# Patient Record
Sex: Female | Born: 1965 | Race: Black or African American | Hispanic: No | Marital: Married | Smoking: Never smoker
Health system: Southern US, Community
[De-identification: ages and names within clinical notes are randomized; demographics above are authoritative.]

## PROBLEM LIST (undated history)

## (undated) DIAGNOSIS — K529 Noninfective gastroenteritis and colitis, unspecified: Secondary | ICD-10-CM

## (undated) DIAGNOSIS — E039 Hypothyroidism, unspecified: Secondary | ICD-10-CM

## (undated) HISTORY — DX: Hypothyroidism, unspecified: E03.9

## (undated) HISTORY — PX: BREAST CYST EXCISION: SHX579

## (undated) HISTORY — PX: THYROIDECTOMY: SHX17

## (undated) HISTORY — PX: DILATION AND CURETTAGE, DIAGNOSTIC / THERAPEUTIC: SUR384

## (undated) HISTORY — PX: COLONOSCOPY: SHX174

## (undated) HISTORY — DX: Noninfective gastroenteritis and colitis, unspecified: K52.9

---

## 2004-01-21 ENCOUNTER — Emergency Department (HOSPITAL_COMMUNITY): Admission: EM | Admit: 2004-01-21 | Discharge: 2004-01-21 | Payer: Self-pay | Admitting: Emergency Medicine

## 2004-01-21 ENCOUNTER — Encounter (INDEPENDENT_AMBULATORY_CARE_PROVIDER_SITE_OTHER): Payer: Self-pay | Admitting: *Deleted

## 2005-01-13 ENCOUNTER — Emergency Department (HOSPITAL_COMMUNITY): Admission: EM | Admit: 2005-01-13 | Discharge: 2005-01-13 | Payer: Self-pay | Admitting: Emergency Medicine

## 2005-02-11 IMAGING — US US ABDOMEN COMPLETE
1 series · 14 of 25 positions shown · non-contrast
Comparison: none

CLINICAL DATA: Patient has abdominal pain.  
 ULTRASOUND OF THE ABDOMEN
 Gallbladder is not optimally dilated however no definite stone.  Gallbladder wall is normal.  The common bile duct is normal measuring 4 to 5 mm.  The liver, IVC, pancreas and spleen are normal.  The right kidney measures 10.2 cm and the left kidney 9.9 cm.  Aorta is normal measuring 1.6 cm.  
 IMPRESSION
 No abnormality.  Gallbladder is not optimally distended however, no definite gallstones are visualized.

[Series 1: unknown · 0.27mm/px · 14 of 89 slices shown]
[im 1/89]
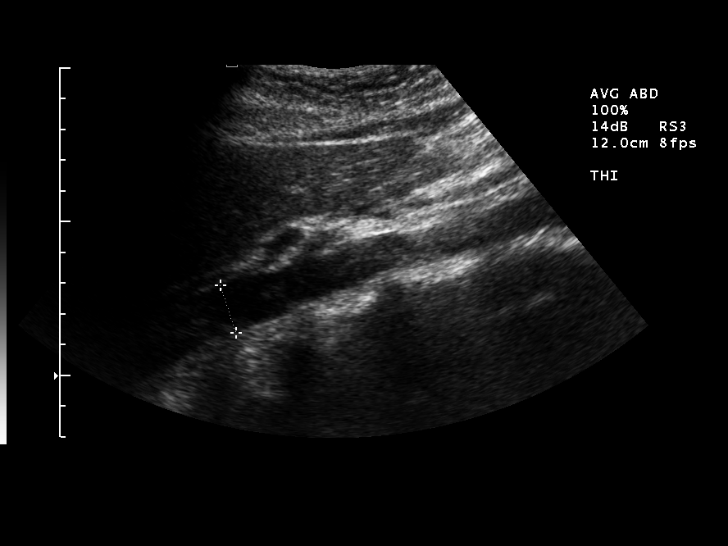
[im 8/89]
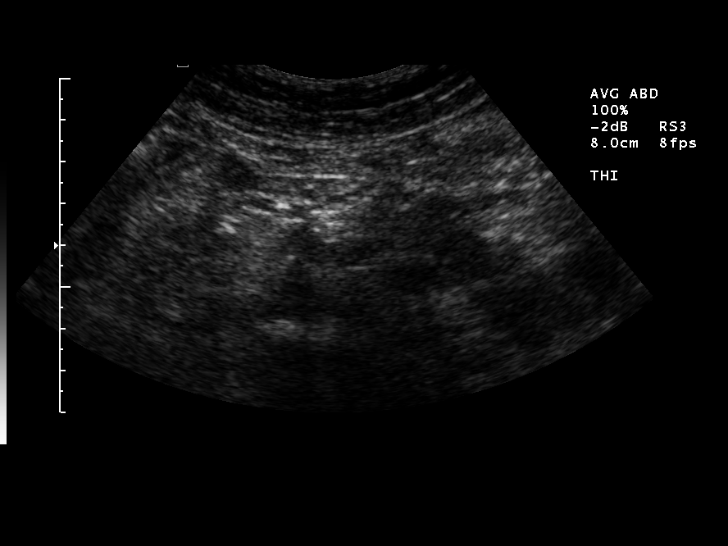
[im 15/89]
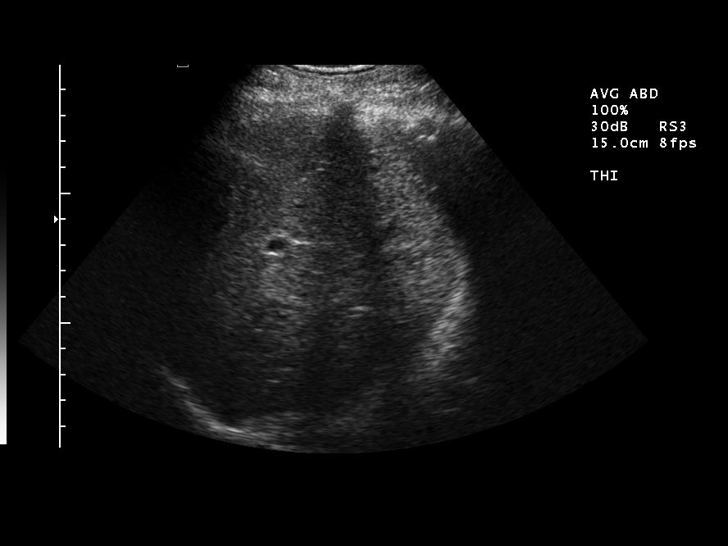
[im 23/89]
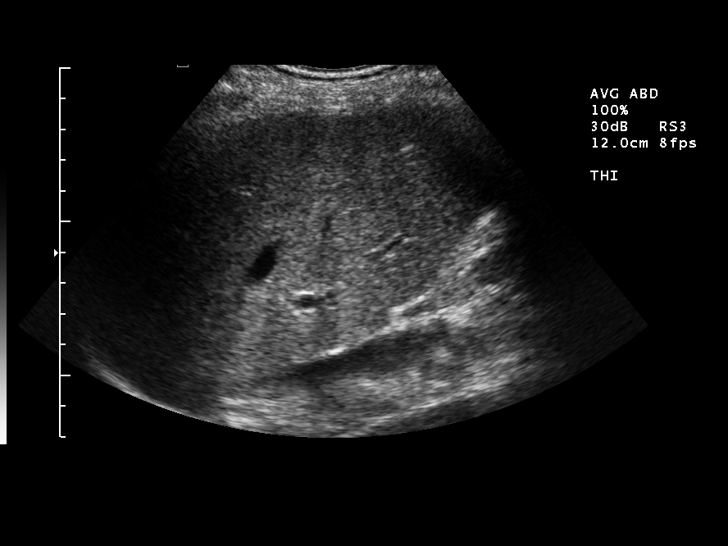
[im 30/89]
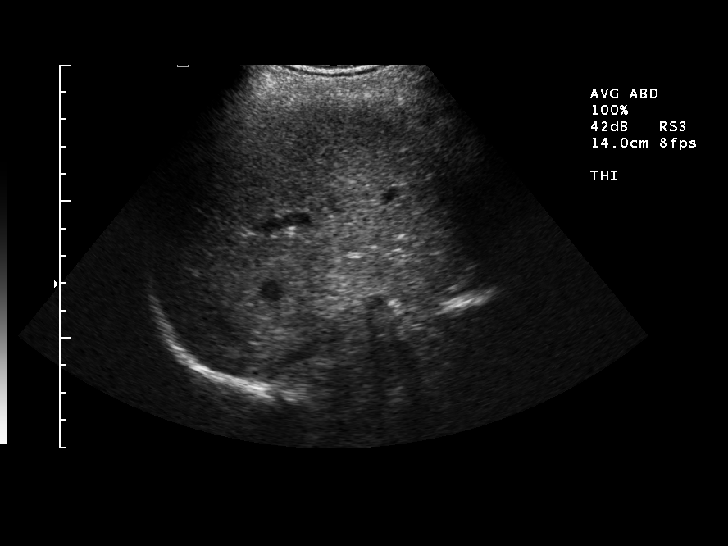
[im 34/89]
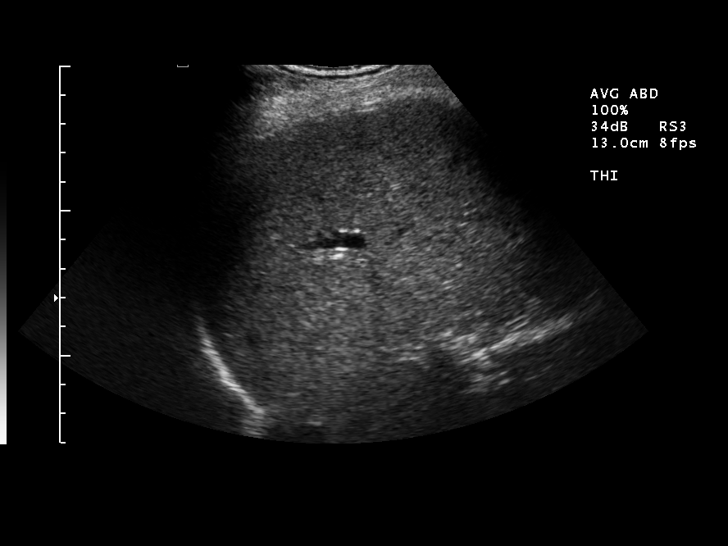
[im 41/89]
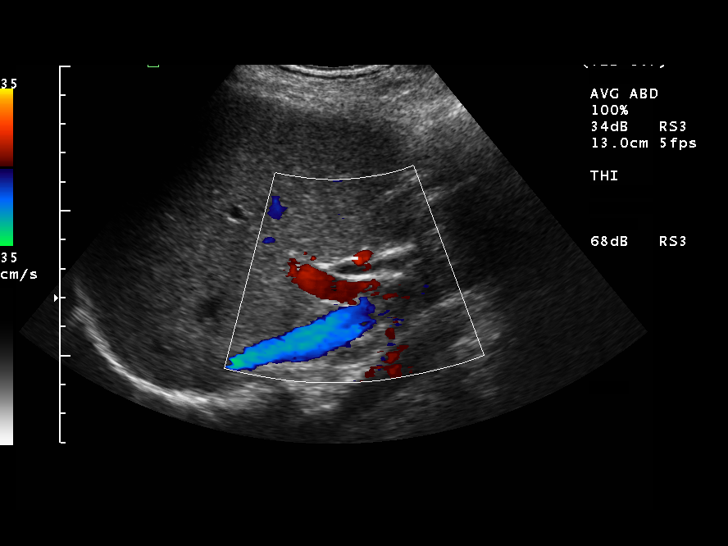
[im 48/89]
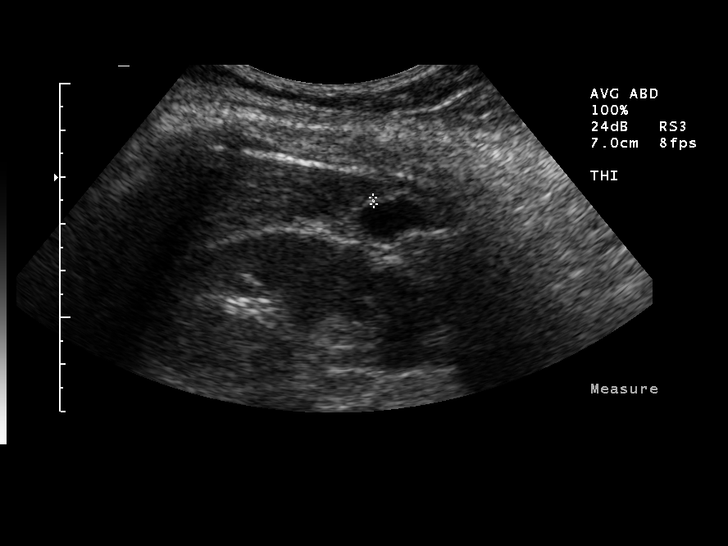
[im 56/89]
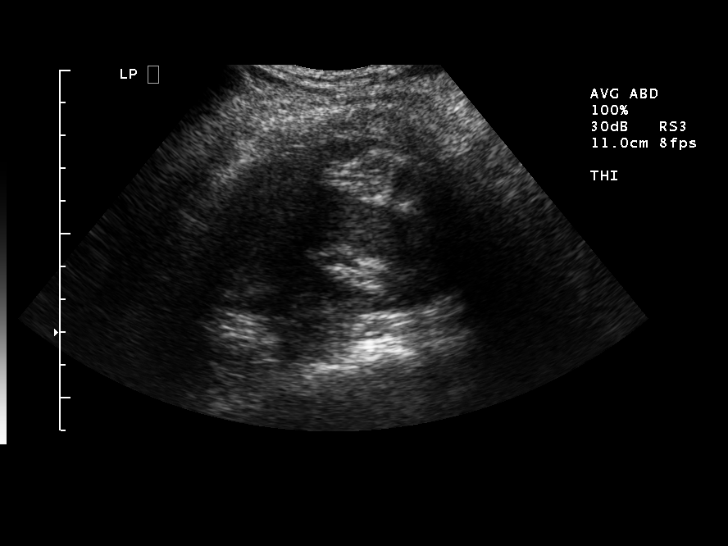
[im 59/89]
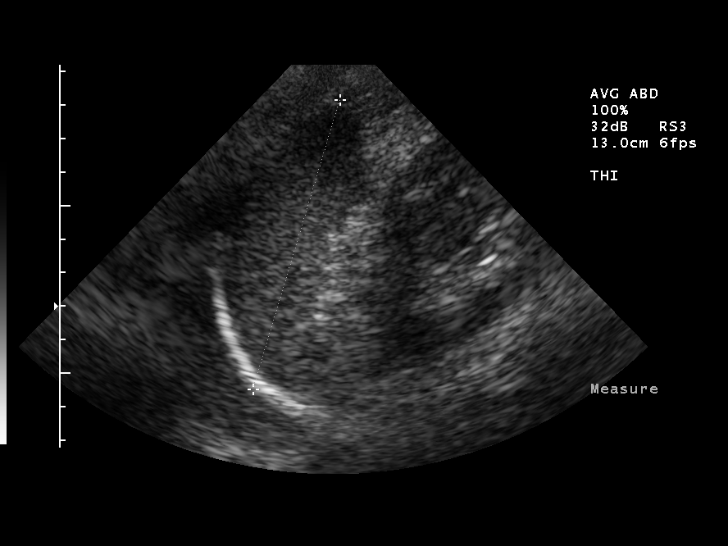
[im 67/89]
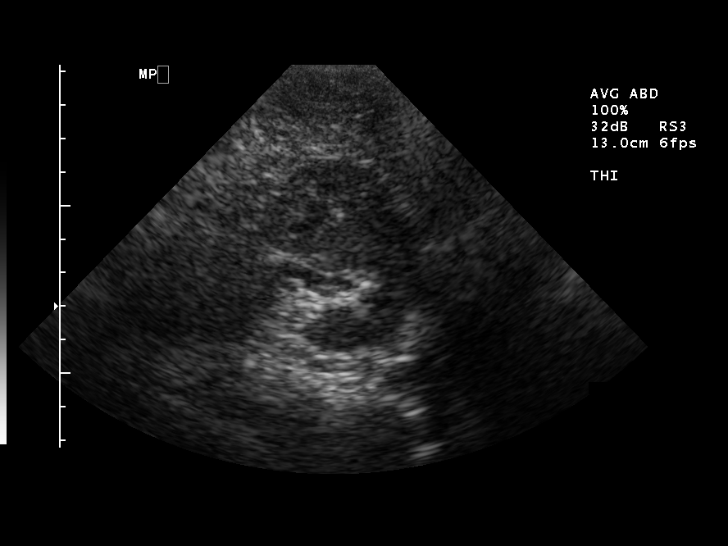
[im 74/89]
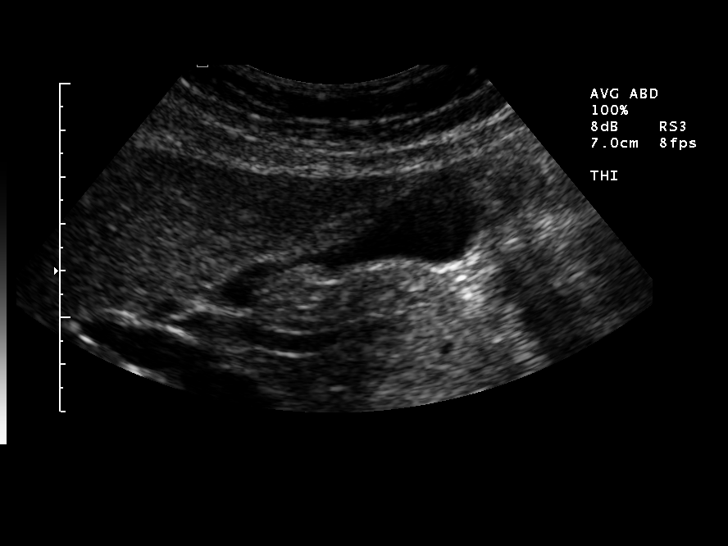
[im 81/89]
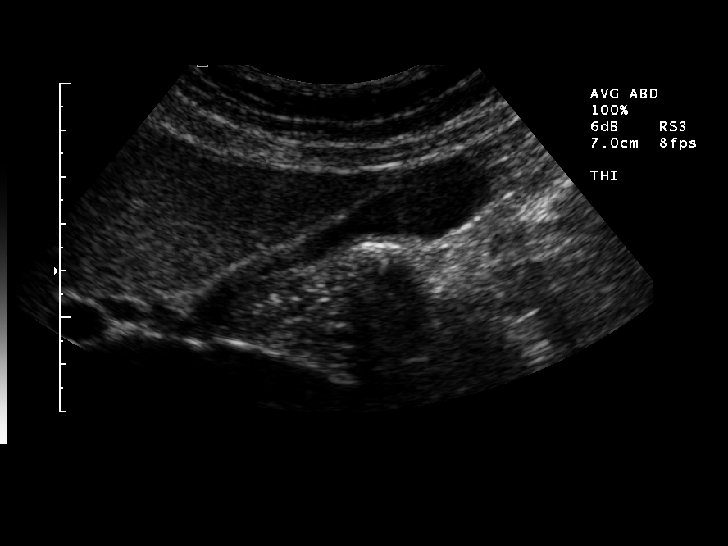
[im 89/89]
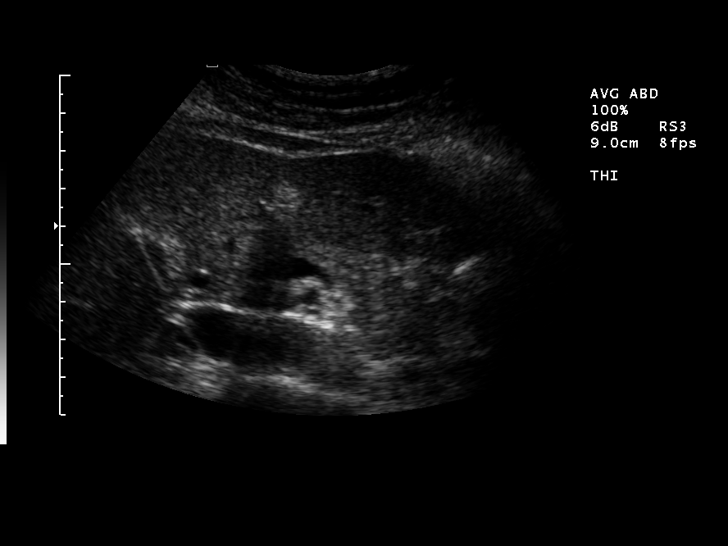

[14 of 25 positions shown; findings below may reference images not displayed]

## 2005-02-23 ENCOUNTER — Ambulatory Visit (HOSPITAL_COMMUNITY): Admission: AD | Admit: 2005-02-23 | Discharge: 2005-02-23 | Payer: Self-pay | Admitting: Obstetrics and Gynecology

## 2005-02-23 ENCOUNTER — Encounter (INDEPENDENT_AMBULATORY_CARE_PROVIDER_SITE_OTHER): Payer: Self-pay | Admitting: Specialist

## 2006-02-04 IMAGING — US US OB COMP LESS 14 WK
1 series · 14 of 28 positions shown · non-contrast
Comparison: none

CLINICAL DATA: 38 year old female with pelvic pain and a positive pregnancy test.
ULTRASOUND OB COMPLETE <14 WEEKS:
Transabdominal scanning only.  Transvaginal scanning was refused by the patient.

[Series 1: unknown · 0.30mm/px · 14 of 33 slices shown]
[im 2/33]
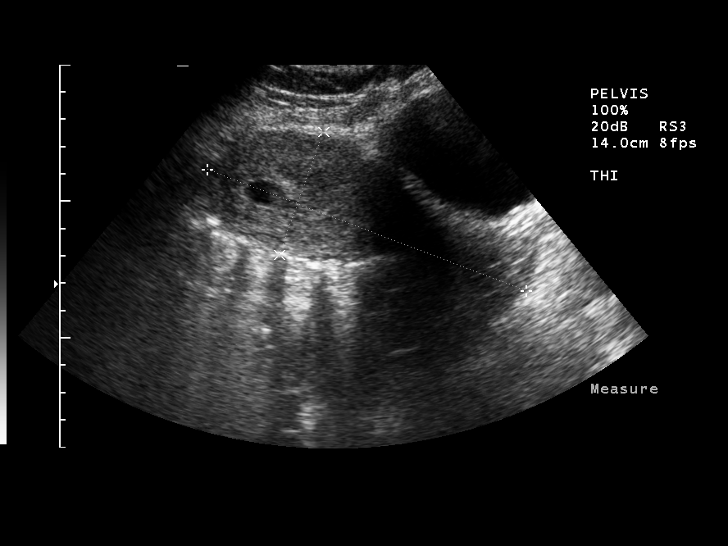
[im 4/33]
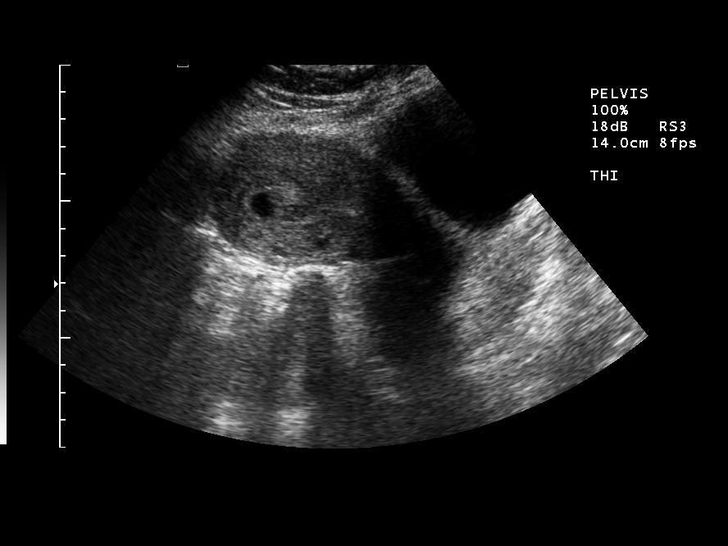
[im 6/33]
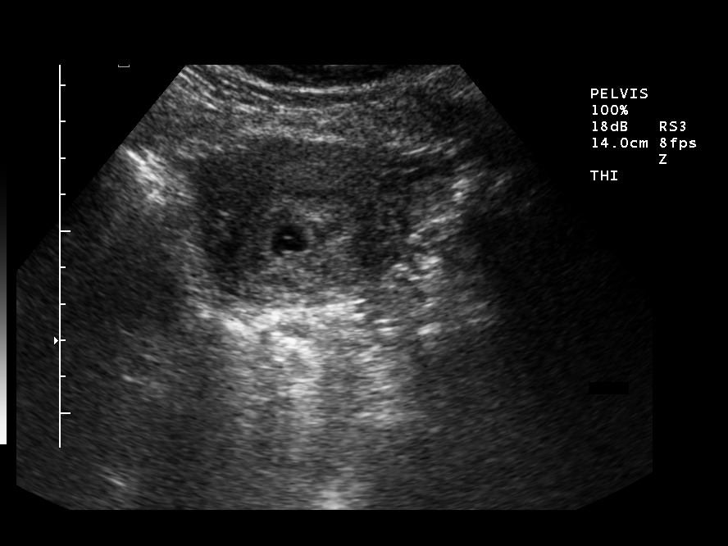
[im 9/33]
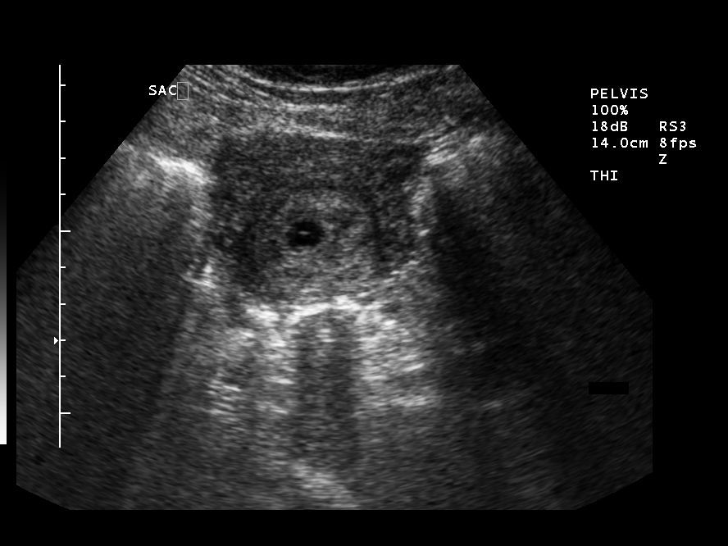
[im 11/33]
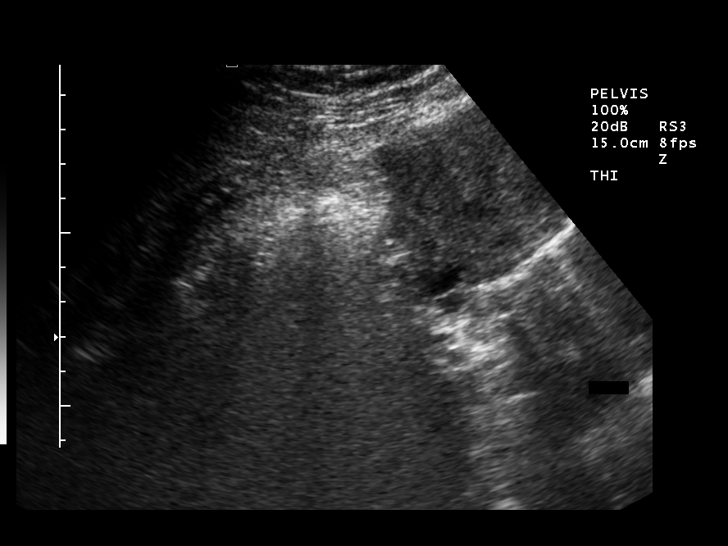
[im 14/33]
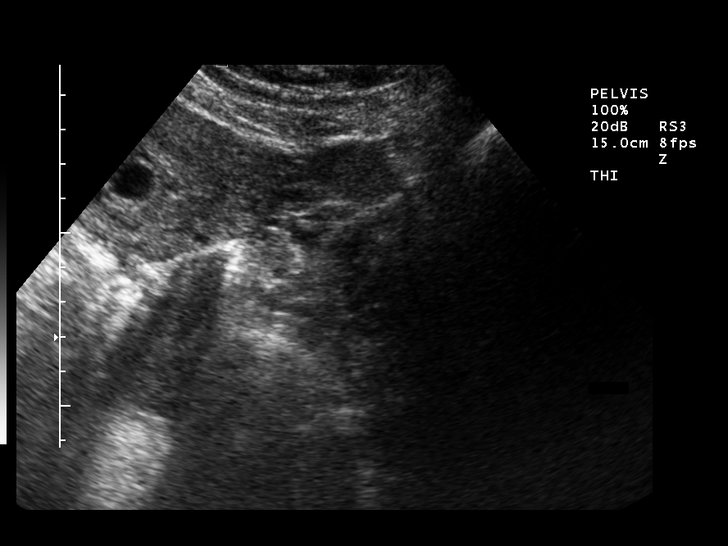
[im 16/33]
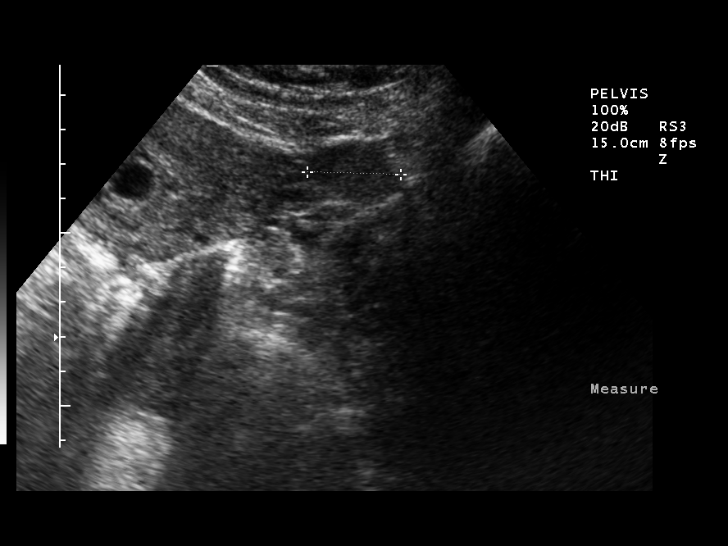
[im 18/33]
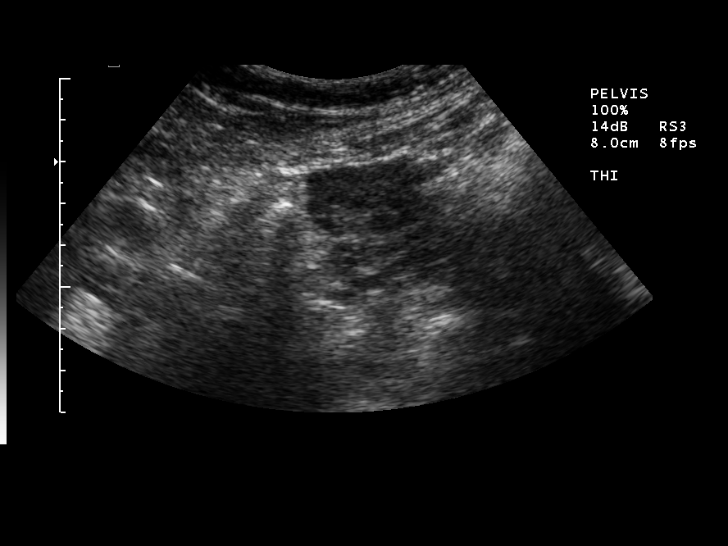
[im 21/33]
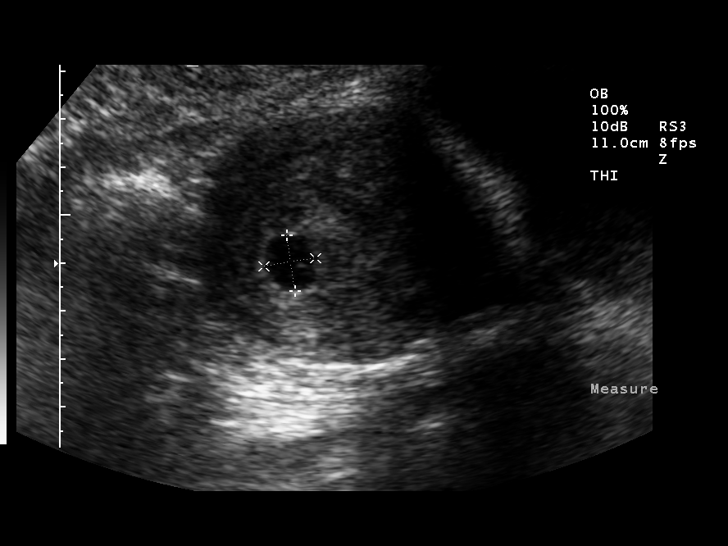
[im 23/33]
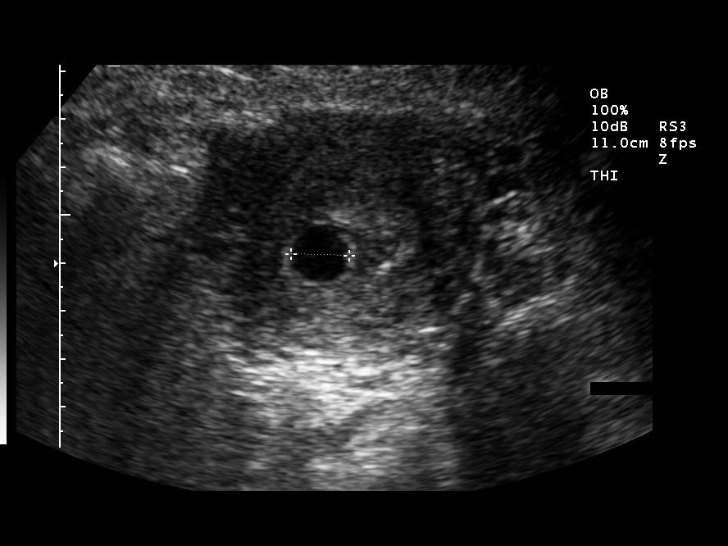
[im 25/33]
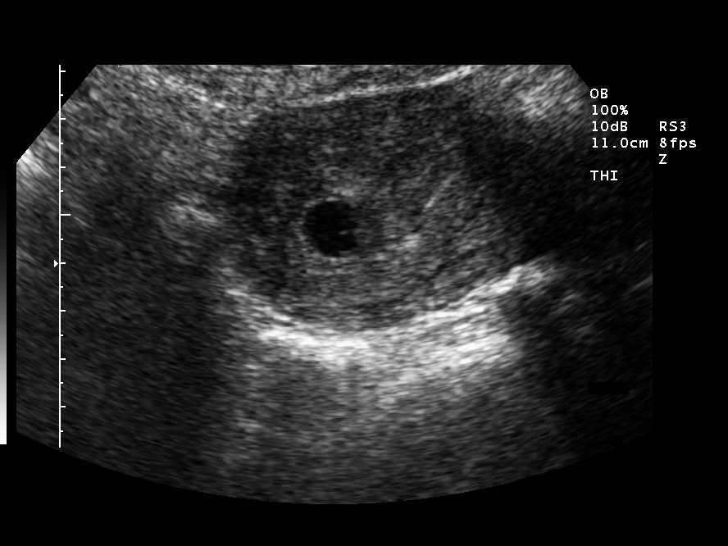
[im 28/33]
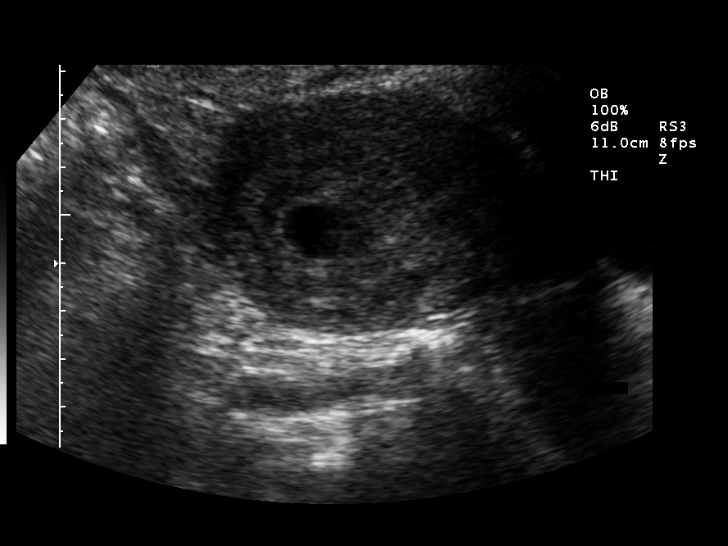
[im 30/33]
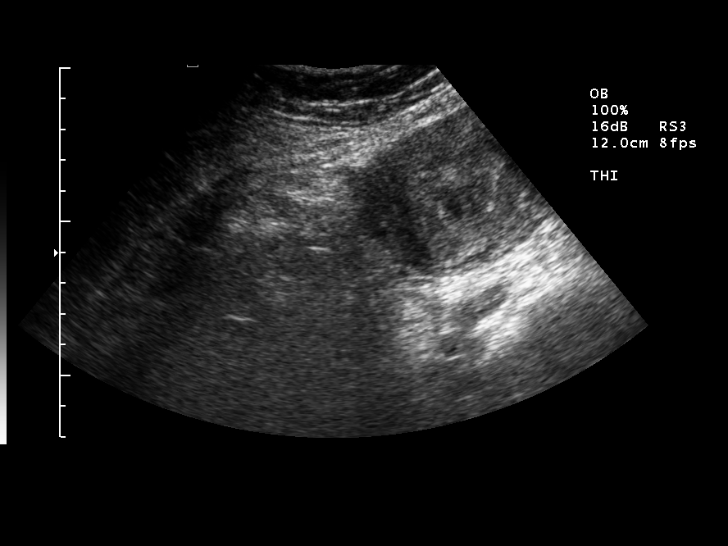
[im 33/33]
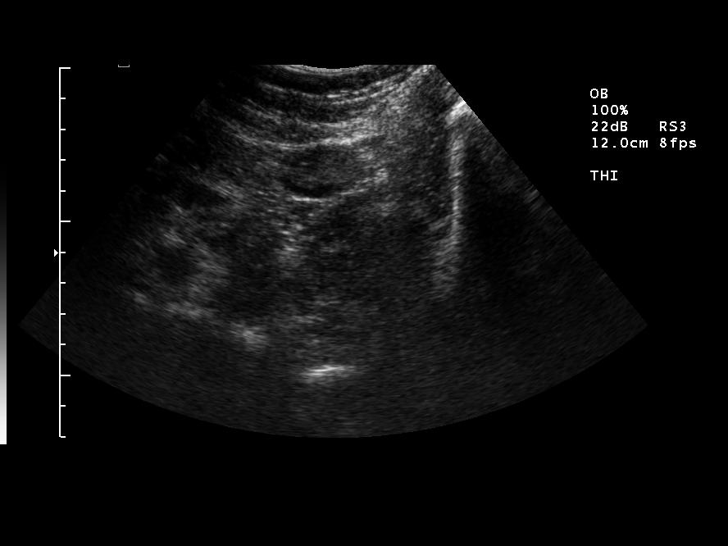

[14 of 28 positions shown; findings below may reference images not displayed]

FINDINGS: A small intrauterine gestational sac is demonstrated.  The mean sac diameter is 11.4 mm correlating with a 5 week 6 day gestational age.  The yolk sac is visualized.  Currently no embryo or fetal pole is identified.  No subchorionic hemorrhage.  The right ovary was not visualized.  The left ovary is normal.  No free fluid.
IMPRESSION: 1.  Early IUP identified with a mean sac diameter of 11.4 mm correlating with a 5 week 6 day gestational age.  
2.  Tiny yolk sac is visualized but no embryo at this early of an age.
3.  No subchorionic hemorrhage.
4.  Nonvisualization of the right ovary.
5.  Normal left ovary.
Note:  Follow-up scanning could be performed to document adequate progression of the pregnancy.

## 2009-11-23 ENCOUNTER — Ambulatory Visit (HOSPITAL_COMMUNITY): Admission: RE | Admit: 2009-11-23 | Discharge: 2009-11-23 | Payer: Self-pay | Admitting: Obstetrics and Gynecology

## 2009-12-22 ENCOUNTER — Encounter (INDEPENDENT_AMBULATORY_CARE_PROVIDER_SITE_OTHER): Payer: Self-pay | Admitting: *Deleted

## 2010-01-04 DIAGNOSIS — E039 Hypothyroidism, unspecified: Secondary | ICD-10-CM | POA: Insufficient documentation

## 2010-01-04 DIAGNOSIS — K625 Hemorrhage of anus and rectum: Secondary | ICD-10-CM

## 2010-01-06 ENCOUNTER — Ambulatory Visit: Payer: Self-pay | Admitting: Internal Medicine

## 2010-01-06 DIAGNOSIS — D649 Anemia, unspecified: Secondary | ICD-10-CM

## 2010-01-18 ENCOUNTER — Telehealth: Payer: Self-pay | Admitting: Internal Medicine

## 2010-02-02 ENCOUNTER — Telehealth: Payer: Self-pay | Admitting: Internal Medicine

## 2010-02-10 ENCOUNTER — Ambulatory Visit: Payer: Self-pay | Admitting: Internal Medicine

## 2010-02-10 DIAGNOSIS — K5190 Ulcerative colitis, unspecified, without complications: Secondary | ICD-10-CM | POA: Insufficient documentation

## 2010-04-28 ENCOUNTER — Telehealth: Payer: Self-pay | Admitting: Internal Medicine

## 2010-05-03 ENCOUNTER — Ambulatory Visit: Payer: Self-pay | Admitting: Internal Medicine

## 2010-05-03 ENCOUNTER — Telehealth: Payer: Self-pay | Admitting: Internal Medicine

## 2010-05-03 DIAGNOSIS — R197 Diarrhea, unspecified: Secondary | ICD-10-CM

## 2010-05-03 DIAGNOSIS — K5289 Other specified noninfective gastroenteritis and colitis: Secondary | ICD-10-CM

## 2010-05-03 DIAGNOSIS — R109 Unspecified abdominal pain: Secondary | ICD-10-CM | POA: Insufficient documentation

## 2010-05-03 DIAGNOSIS — R1084 Generalized abdominal pain: Secondary | ICD-10-CM

## 2010-05-04 LAB — CONVERTED CEMR LAB
CRP, High Sensitivity: 3.04 (ref 0.00–5.00)
MCHC: 31.5 g/dL (ref 30.0–36.0)
RBC: 4.73 M/uL (ref 3.87–5.11)
RDW: 20.6 % — ABNORMAL HIGH (ref 11.5–14.6)

## 2010-05-09 ENCOUNTER — Ambulatory Visit: Payer: Self-pay | Admitting: Physician Assistant

## 2010-05-09 ENCOUNTER — Ambulatory Visit: Payer: Self-pay | Admitting: Internal Medicine

## 2010-05-11 ENCOUNTER — Encounter: Payer: Self-pay | Admitting: Internal Medicine

## 2010-06-21 ENCOUNTER — Ambulatory Visit: Payer: Self-pay | Admitting: Internal Medicine

## 2010-06-24 LAB — CONVERTED CEMR LAB
Ferritin: 7 ng/mL — ABNORMAL LOW (ref 10.0–291.0)
Saturation Ratios: 3.1 % — ABNORMAL LOW (ref 20.0–50.0)
Transferrin: 327.6 mg/dL (ref 212.0–360.0)

## 2010-07-06 ENCOUNTER — Telehealth: Payer: Self-pay | Admitting: Internal Medicine

## 2010-08-08 ENCOUNTER — Ambulatory Visit: Payer: Self-pay | Admitting: Internal Medicine

## 2010-09-27 NOTE — Letter (Signed)
Summary: Patient Notice- Colon Biospy Results  [facility]  [address] [place], [place] [address]   Phone: [phone]  Fax: [phone]        May 11, 2010 MRN: [id number]    [facility] [address] [place], [place]  [address]    Dear Ms. Bernabe,  I am pleased to inform you that the biopsies taken during your recent colonoscopy did not show any evidence of cancer upon pathologic examination.The biopsies show active ulcerative colitis in 3 out of 4 biopsies, Your colitis is predominanetly in the left side of Your colon  Additional information/recommendations:  __No further action is needed at this time.  Please follow-up with      your primary care physician for your other healthcare needs.  _x_Please call [phone] to schedule a return visit to review      your condition.My office will call You with an appointment  _x_Continue with the treatment plan as outlined on the day of your      exam.In 2 weeks, You can go down on Your Prednisone to 20 mg/day.  _x_You should have a repeat colonoscopy examination for this problem           in _5 years.  Please call us if you are having persistent problems or have questions about your condition that have not been fully answered at this time.Please take Iron every day.  Sincerely,  Hart Carwin MD   This letter has been electronically signed by your physician.  Appended Document: Patient Notice- Colon Biospy Results letter mailed

## 2010-09-27 NOTE — Assessment & Plan Note (Signed)
Summary: F/U APPT...LSW.   History of Present Illness Visit Type: Follow-up Visit Primary GI MD: [name] MD Primary Provider: Huel Cote, MD Requesting Provider: Huel Cote, MD Chief Complaint: Rectal bleeding has stopped, black tarry stools now History of Present Illness:   This is a 45 year old African female from [place], with new onset ulcerative colitis. She declines   colonoscopy due to being uninsured. She responded to an empirical  prednisone taper and has been off prednisone completely for several weeks. We have been able to give her samples of Lialda 1.2 gm which she initially took two a day but developed severe headaches and we had to decrease it to once a day. She is currently doing well and has not seen any blood in her stool for the past several days. Her stools are dark but formed. There has been no abdominal pain.   GI Review of Systems    Reports abdominal pain.     Location of  Abdominal pain: lower abdomen.    Denies acid reflux, belching, bloating, chest pain, dysphagia with liquids, dysphagia with solids, heartburn, loss of appetite, nausea, vomiting, vomiting blood, weight loss, and  weight gain.      Reports black tarry stools, change in bowel habits, constipation, diarrhea, and  rectal bleeding.     Denies anal fissure, diverticulosis, fecal incontinence, heme positive stool, hemorrhoids, irritable bowel syndrome, jaundice, light color stool, liver problems, and  rectal pain.    Current Medications (verified): 1)  Anusol-Hc 25 Mg Supp (Hydrocortisone Acetate) .... Insert 1 Suppository Into Rectum At Bedtime  Allergies (verified): No Known Drug Allergies  Past History:  Past Medical History: Reviewed history from 01/04/2010 and no changes required. Current Problems:  HYPOTHYROIDISM (ICD-244.9) RECTAL BLEEDING (ICD-569.3)    Past Surgical History: C-Section x 3 D &C after missed abortion in 2006 Thyroidectomy Breast Cystectomy D & C  -10/2009  Family History: Reviewed history from 01/06/2010 and no changes required. No FH of Colon Cancer: Stomach Cancer: MGM Family History of Diabetes: PGF Family History of Heart Disease: Father   Social History: Reviewed history from 01/06/2010 and no changes required. Self Employed Married 3 childern Patient has never smoked.  Alcohol Use - no Illicit Drug Use - no  Review of Systems       The patient complains of allergy/sinus, fatigue, headaches-new, muscle pains/cramps, night sweats, sleeping problems, swelling of feet/legs, and thirst - excessive.  The patient denies anemia, anxiety-new, arthritis/joint pain, back pain, blood in urine, breast changes/lumps, change in vision, confusion, cough, coughing up blood, depression-new, fainting, fever, hearing problems, heart murmur, heart rhythm changes, itching, menstrual pain, nosebleeds, pregnancy symptoms, shortness of breath, skin rash, sore throat, swollen lymph glands, thirst - excessive , urination - excessive , urination changes/pain, urine leakage, vision changes, and voice change.         Pertinent positive and negative review of systems were noted in the above HPI. All other ROS was otherwise negative.   Vital Signs:  Patient profile:   45 year old female Height:      64 inches Weight:      137.50 pounds BMI:     23.69 Pulse rate:   100 / minute Pulse rhythm:   regular BP sitting:   106 / 70  (right arm) Cuff size:   regular  Vitals Entered By: June McMurray CMA Duncan Dull) (February 10, 2010 11:02 AM)  Physical Exam  General:  Well developed, well nourished, no acute distress. Mouth:  No deformity or  lesions, dentition normal. Lungs:  Clear throughout to auscultation. Heart:  Regular rate and rhythm; no murmurs, rubs,  or bruits. Abdomen:  soft nontender abdomen with normoactive bowel sounds. No distention. Minimal discomfort on deep pressure in left lower quadrant. Rectal:  soft Hemoccult positive  stool. Extremities:  No clubbing, cyanosis, edema or deformities noted. Skin:  Intact without significant lesions or rashes. Psych:  Alert and cooperative. Normal mood and affect.   Impression & Recommendations:  Problem # 1:  RECTAL BLEEDING (ICD-569.3) Bleeding has stopped after taking prednisone taper.  Problem # 2:  UNSPECIFIED ULCERATIVE COLITIS (ICD-556.9) Patient has responded to prednisone taper. She is currently doing well on Lialda 1.2 g daily. We will increase to 2 tablets a day. I have given her more samples. She will need a colonoscopy when she acquires medical insurance. She is to call in next 4 weeks to report on her status. She will take Lialda 2-4 tablets a day gradually.  Patient Instructions: 1)  Lialda 1.2 g take 2 a day. 2)  Call us with status update in 4 weeks. 3)  Colonoscopy in the the near future. 4)  Copy sent to :Dr Huel Cote 5)  The medication list was reviewed and reconciled.  All changed / newly prescribed medications were explained.  A complete medication list was provided to the patient / caregiver.

## 2010-09-27 NOTE — Letter (Signed)
Summary: Appt Reminder 2  [facility]  [address] [place], [place] [address]   Phone: [phone]  Fax: [phone]        May 11, 2010 MRN: [id number]    [facility] [address] [place], [place]  [address]    Dear Ms. Flynt,   You have a return appointment with Dr. Juanda Chance on 06/13/10 at 10:15 a.m.  Please remember to bring a complete list of the medicines you are taking, your insurance card and your co-pay.  If you have to cancel or reschedule this appointment, please call before 5:00 pm the evening before to avoid a cancellation fee.  If you have any questions or concerns, please call [phone].    Sincerely,    Darcey Nora RN, CGRN  Appended Document: Appt Reminder 2 letter mailed to patient's home

## 2010-09-27 NOTE — Progress Notes (Signed)
Summary: triage  Phone Note Call from Patient Call back at Home Phone [phone]   Caller: Patient Call For: Dr Juanda Chance Reason for Call: Talk to Nurse Summary of Call: Patient wants to speak to nurse regarding severe diarrhea and blood in her stools, states that treatment given by Dr Juanda Chance is not working. Initial call taken by: Tawni Levy,  April 28, 2010 11:12 AM  Follow-up for Phone Call        patient is scheduled to come for an office visit 05/06/10 11:30 Follow-up by: Darcey Nora RN, CGRN,  April 28, 2010 12:02 PM

## 2010-09-27 NOTE — Progress Notes (Signed)
Summary: advise  Phone Note Call from Patient Call back at Home Phone [phone]   Caller: Patient Call For: Dr. Juanda Chance Reason for Call: Talk to Nurse Summary of Call: would like to consult directly with Dr. Juanda Chance about what her next step should be since finishing Prednisone Initial call taken by: Vallarie Mare,  July 06, 2010 11:45 AM  Follow-up for Phone Call        Pt. says she has seen improvement in stools as they are still loose but does not see any blood.Given samples of Lialda and r.o.v.appt as she was unable to keep prior appt.Says she still wants to speak to Dr.Brodie personally and I informed her that the dr.is not in the office the rest of the week but that I will forward this request to her desktop. Follow-up by: Teryl Lucy RN,  July 06, 2010 12:26 PM

## 2010-09-27 NOTE — Assessment & Plan Note (Signed)
Summary: rectal bleeding, diarrhea, vomiting/sheri   History of Present Illness Visit Type: Follow-up Visit Primary GI MD: [name] MD Primary Provider: Huel Cote, MD Requesting Provider: n/a Chief Complaint: pt had diarrhea and vomiting last night, pt also states she has BRB with each BM, she sees the blood on the stool, tissue and in toilet.  Pt also states the bleeding she was having last night was like bloody mucous History of Present Illness:   45 Y.O AFRICAN FEMALE FROM  [place],RECENTLY KNOWN TO DR. Juanda Chance. SHE HAS BEEN GIVEN DX OF COLITIS BASED ON SXS, AND ANOSCOPY SHOWING A PROCTITIS. SHE DID NOT HAVE A COLONOSCOPY AS SHE DID NOT HAVE INSURANCE.SHE WAS TREATED WITH A COURSE OF STEROIDS WHICH SHE DID RESPOND TO, AND HAS BEEN ON LOW DOSE LIALDA  OVER THE PAST FEW MONTHS-LAST SEEN 6/11.SHE FEELS THE LIALDA AT HIGHER DOSE GAVE HER HEADACHES. SHE COMES IN NOW AFTER A BAD NIGHT LAST NIGHT WITH ABDOMINAL CRAMPING,DIARRHEA,BLOOD IN STTOLS AND VOMITING. SHE IS KEEPING FLUIDS DOWN TODAY. NO FEVER/CHILLS. SHE SAYS SHE HAS NOT FELT WELL ALL SUMMER ,AND HAS HAD ONGOING DIARRHEA,WITH SEVERAL BM'S DAILY, AND SMALL AMTS OF BLOOD. WEIGHT HAS BEEN STABLE.   GI Review of Systems    Reports vomiting.      Denies abdominal pain, acid reflux, belching, bloating, chest pain, dysphagia with liquids, dysphagia with solids, heartburn, loss of appetite, nausea, vomiting blood, weight loss, and  weight gain.      Reports diarrhea and  rectal bleeding.     Denies anal fissure, black tarry stools, change in bowel habit, constipation, diverticulosis, fecal incontinence, heme positive stool, hemorrhoids, irritable bowel syndrome, jaundice, light color stool, liver problems, and  rectal pain.               Current Medications (verified): 1)  Lialda 1.2 Gm Tbec (Mesalamine) .... Take 2 Tablets By Mouth Once Daily  Allergies (verified): No Known Drug Allergies  Past History:  Past Medical  History: Current Problems:  HYPOTHYROIDISM (ICD-244.9) UNSPECIFIED COLITIS    Past Surgical History: C-Section x 3 D &C after missed abortion in 2006 Thyroidectomy Breast Cystectomy D & C -10/2009      Family History: Reviewed history from 01/06/2010 and no changes required. No FH of Colon Cancer: Stomach Cancer: MGM Family History of Diabetes: PGF Family History of Heart Disease: Father   Social History: Reviewed history from 01/06/2010 and no changes required. Self Employed Married 3 childern Patient has never smoked.  Alcohol Use - no Illicit Drug Use - no  Review of Systems  The patient denies allergy/sinus, anemia, anxiety-new, arthritis/joint pain, back pain, blood in urine, breast changes/lumps, confusion, cough, coughing up blood, depression-new, fainting, fatigue, fever, headaches-new, hearing problems, heart murmur, heart rhythm changes, itching, menstrual pain, muscle pains/cramps, night sweats, nosebleeds, pregnancy symptoms, shortness of breath, skin rash, sleeping problems, sore throat, swelling of feet/legs, swollen lymph glands, thirst - excessive, urination - excessive, urination changes/pain, urine leakage, vision changes, and voice change.         SEE HPI  Vital Signs:  Patient profile:   45 year old female Height:      64 inches Weight:      136 pounds BMI:     23.43 Pulse rate:   80 / minute Pulse rhythm:   regular BP sitting:   92 / 56  (right arm) Cuff size:   regular  Vitals Entered By: Francee Piccolo CMA Duncan Dull) (May 03, 2010 2:49 PM)  Physical  Exam  General:  Well developed, well nourished, no acute distress. Head:  Normocephalic and atraumatic. Eyes:  PERRLA, no icterus. Lungs:  Clear throughout to auscultation. Heart:  Regular rate and rhythm; no murmurs, rubs,  or bruits. Abdomen:  SOFT, MILD TENDERNESS BILATERAL LQ, NO GUARDING OR REBOUND,BS + Rectal:  NOT DONE Neurologic:  Alert and  oriented x4;  grossly normal  neurologically. Psych:  Alert and cooperative. Normal mood and affect.   Impression & Recommendations:  Problem # 1:  COLITIS (ICD-558.9) Assessment Deteriorated 44 YO FEMALE WITH PROBABLE COLITIS-CROHNS VS ULCERATIVE COLITIS  WITH PERSISTENT DIARRHEA,RECTAL BLEEDING,LOWER ABDOMINAL DISCOMFORT. SXS PROGRESSIVE OVER THE PAST FEW MONTHS. COLONOSCOPY HAS NOT BEEN DONE TO DATE.  SCHEDULE COLONOSCOPY WITH DR. Juanda Chance ;PROCEDURE DISCUSSED IN DETAIL WITH THE PT AND SHE IS AGREEABLE. LABS AS BELOW CONTINUE LIALDA 1.2,TWICE DAILY RESTART PREDNISONE 30 MG DAILY IN INTERIM UNTIL COLONOSCOPY COMPLETED. BENTYL 10 MG 2-3 X DAILY AS NEEDED FOR CRAMPING. Orders: TLB-CRP-High Sensitivity (C-Reactive Protein) ([id number]) TLB-CBC Platelet - w/Differential ([id number]) Colonoscopy (Colon)  Patient Instructions: 1)  Please go to lab, basement level. 2)  We sent prescrtiptions to [facility] Prednisone, Moviprep,  amd Bentyl.  3)  We scheduled the Colonoscopy with Dr Juanda Chance on 05-05-10.  4)  Directions and brochure provided. 5)  [facility] Patient Information Guide given to patient. 6)  Copy sent to : Huel Cote, Md 7)  The medication list was reviewed and reconciled.  All changed / newly prescribed medications were explained.  A complete medication list was provided to the patient / caregiver. Prescriptions: MOVIPREP 100 GM  SOLR (PEG-KCL-NACL-NASULF-NA ASC-C) As per prep instructions.  #1 x 0   Entered by:   Lowry Ram NCMA   Authorized by:   Sammuel Cooper PA-c   Signed by:   Lowry Ram NCMA on 05/03/2010   Method used:   Electronically to        [facility] (retail)       [address]       [place], [place]  [address]       Ph: [phone]       Fax: [phone]   RxID:   [id number] PREDNISONE 20 MG TABS (PREDNISONE) Take 1 and 1/2  tab daily in the AM  #45 x 0   Entered by:   Lowry Ram NCMA   Authorized by:   Sammuel Cooper PA-c   Signed by:   Lowry Ram NCMA on 05/03/2010   Method used:   Electronically to        [facility] (retail)       [address]       [place], [place]  [address]       Ph: [phone]       Fax: [phone]   RxID:   [id number] BENTYL 10 MG CAPS (DICYCLOMINE HCL) Take 1 tab 3 times daily for cramping  #90 x 1   Entered by:   Lowry Ram NCMA   Authorized by:   Sammuel Cooper PA-c   Signed by:   Lowry Ram NCMA on 05/03/2010   Method used:   Electronically to        [facility] (retail)       [address]       [place],  [place]  [address]       Ph: [phone]       Fax: [phone]   RxID:   [id number]

## 2010-09-27 NOTE — Progress Notes (Signed)
Summary: Triage  Phone Note Call from Patient Call back at Home Phone [phone]   Caller: Patient Call For: Dr. Juanda Chance Reason for Call: Talk to Nurse Summary of Call: pt. asking to speak directly with nurse. Having diarrhea, vomiting and rectal bleeding Initial call taken by: Karna Christmas,  May 03, 2010 10:47 AM  Follow-up for Phone Call        patient is having worsening of her symptoms, rectal bleeding, vomiting, and diarreha. She will come in and see Mike Gip PA today at 2:30 Follow-up by: Darcey Nora RN, CGRN,  May 03, 2010 11:09 AM

## 2010-09-27 NOTE — Progress Notes (Signed)
Summary: Triage-Condition Update  Phone Note Call from Patient Call back at Home Phone [phone]   Caller: Patient Call For: Dr. Juanda Chance Reason for Call: Talk to Nurse Summary of Call: Pt. still has blood in stool and diarrhea. Initial call taken by: Karna Christmas,  Jan 18, 2010 10:24 AM  Follow-up for Phone Call        Last seen 01-06-10. Is on Prednisone 20mg  currently, Anusol supp. at bedtime. Pt. reports bleeding has decreased but she continues w/urgent,multiple water stools each day.  DR.Kalandra Masters PLEASE ADVISE  Follow-up by: Laureen Ochs LPN,  Jan 18, 2010 10:41 AM  Additional Follow-up for Phone Call Additional follow up Details #1::        Please start Lialda 1.2 gm, samples, 2 by mouth two times a day, she has no insurance, go back to Prednisone 20 mg/day. Additional Follow-up by: Hart Carwin MD,  Jan 18, 2010 10:56 PM    Additional Follow-up for Phone Call Additional follow up Details #2::    Above MD orders reviewed with patient. She will get samples today and keep her f/u appointment with Dr.Doug Bucklin on 02-10-10. Pt. instructed to call back as needed.  (Lialda samples Lot [id number]  Exp. 12/2011) Follow-up by: Laureen Ochs LPN,  Jan 19, 2010 1:27 PM

## 2010-09-27 NOTE — Letter (Signed)
Summary: [facility] Instructions  [facility]  [address] [place], [place] [address]   Phone: [phone]  Fax: [phone]       [facility] [place]    July 29, 1966    MRN: [id number]        Procedure Day /Date: 05-05-10     Arrival Time: 1:00 PM      Procedure Time: 2:00 PM     Location of Procedure:                    X    [facility] (4th Floor)   PREPARATION FOR COLONOSCOPY WITH MOVIPREP   Starting today 05-03-10  do not eat nuts, seeds, popcorn, corn, beans, peas,  salads, or any raw vegetables.  Do not take any fiber supplements (e.g. Metamucil, Citrucel, and Benefiber).  THE DAY BEFORE YOUR PROCEDURE         DATE: 05-04-10   DAY: Wednesday  1.  Drink clear liquids the entire day-NO SOLID FOOD  2.  Do not drink anything colored red or purple.  Avoid juices with pulp.  No orange juice.  3.  Drink at least 64 oz. (8 glasses) of fluid/clear liquids during the day to prevent dehydration and help the prep work efficiently.  CLEAR LIQUIDS INCLUDE: Water Jello Ice Popsicles Tea (sugar ok, no milk/cream) Powdered fruit flavored drinks Coffee (sugar ok, no milk/cream) Gatorade Juice: apple, white grape, white cranberry  Lemonade Clear bullion, consomm, broth Carbonated beverages (any kind) Strained chicken noodle soup Hard Candy                             4.  In the morning, mix first dose of MoviPrep solution:    Empty 1 Pouch A and 1 Pouch B into the disposable container    Add lukewarm drinking water to the top line of the container. Mix to dissolve    Refrigerate (mixed solution should be used within 24 hrs)  5.  Begin drinking the prep at 5:00 p.m. The MoviPrep container is divided by 4 marks.   Every 15 minutes drink the solution down to the next mark (approximately 8 oz) until the full liter is complete.   6.  Follow completed prep with 16 oz of clear liquid of your choice (Nothing red or purple).  Continue to drink clear liquids until  bedtime.  7.  Before going to bed, mix second dose of MoviPrep solution:    Empty 1 Pouch A and 1 Pouch B into the disposable container    Add lukewarm drinking water to the top line of the container. Mix to dissolve    Refrigerate  THE DAY OF YOUR PROCEDURE      DATE:05-05-10 DAY: Thursday  Beginning at 9:00 AM  (5 hours before procedure):         1. Every 15 minutes, drink the solution down to the next mark (approx 8 oz) until the full liter is complete.  2. Follow completed prep with 16 oz. of clear liquid of your choice.    3. You may drink clear liquids until Noon (2 HOURS BEFORE PROCEDURE).   MEDICATION INSTRUCTIONS  Unless otherwise instructed, you should take regular prescription medications with a small sip of water   as early as possible the morning of your procedure.         OTHER INSTRUCTIONS  You will need a responsible adult at least 47 years of  age to accompany you and drive you home.   This person must remain in the waiting room during your procedure.  Wear loose fitting clothing that is easily removed.  Leave jewelry and other valuables at home.  However, you may wish to bring a book to read or  an iPod/MP3 player to listen to music as you wait for your procedure to start.  Remove all body piercing jewelry and leave at home.  Total time from sign-in until discharge is approximately 2-3 hours.  You should go home directly after your procedure and rest.  You can resume normal activities the  day after your procedure.  The day of your procedure you should not:   Drive   Make legal decisions   Operate machinery   Drink alcohol   Return to work  You will receive specific instructions about eating, activities and medications before you leave.    The above instructions have been reviewed and explained to me by   _______________________    I fully understand and can verbalize these instructions _____________________________ Date _________

## 2010-09-27 NOTE — Miscellaneous (Signed)
Summary: rx diazepam  Clinical Lists Changes  Medications: Added new medication of DIAZEPAM 5 MG  TABS (DIAZEPAM) 1 by mouth Q 8 hrs as needed steroid induced anxiety - Signed Rx of DIAZEPAM 5 MG  TABS (DIAZEPAM) 1 by mouth Q 8 hrs as needed steroid induced anxiety;  #60 x 1;  Signed;  Entered by: Joylene John RN;  Authorized by: Hart Carwin MD;  Method used: Telephoned to [facility] [address], [place], [place], [place]  [address], Ph: [phone], Fax: [phone]    Prescriptions: DIAZEPAM 5 MG  TABS (DIAZEPAM) 1 by mouth Q 8 hrs as needed steroid induced anxiety  #60 x 1   Entered by:   Joylene John RN   Authorized by:   Hart Carwin MD   Signed by:   Joylene John RN on 05/09/2010   Method used:   Telephoned to ...       [facility]       [address]       [place], [place]  [address]       Ph: [phone]       Fax: [phone]   RxID:   [id number]

## 2010-09-27 NOTE — Procedures (Signed)
Summary: Colonoscopy  Patient: Stephanie Wilkerson Note: All result statuses are Final unless otherwise noted.  Tests: (1) Colonoscopy (COL)   COL Colonoscopy           DONE     [facility]     [address]     [place], [place]  [address]           COLONOSCOPY PROCEDURE REPORT           PATIENT:  Stephanie Wilkerson, Stephanie Wilkerson  MR#:  [id number]     BIRTHDATE:  1966/03/25, 44 yrs. old  GENDER:  female     ENDOSCOPIST:  Hedwig Morton. Juanda Chance, MD     REF. BY:  Huel Cote, M.D.     PROCEDURE DATE:  05/09/2010     PROCEDURE:  Colonoscopy 19147     ASA CLASS:  Class II     INDICATIONS:  hematochezia, unexplained diarrhea, change in bowel     habits suspected ulcerative colitis,     MEDICATIONS:   Versed 7 mg, Fentanyl 50 mcg           DESCRIPTION OF PROCEDURE:   After the risks benefits and     alternatives of the procedure were thoroughly explained, informed     consent was obtained.  Digital rectal exam was performed and     revealed no rectal masses.   The LB [id number] endoscope     was introduced through the anus and advanced to the cecum, which     was identified by both the appendix and ileocecal valve, without     limitations.  The quality of the prep was good, using MiraLax.     The instrument was then slowly withdrawn as the colon was fully     examined.     <<PROCEDUREIMAGES>>           FINDINGS:  There were mucosal changes consistent with left-sided     ulcerative colitis. in the sigmoid to descending colon segments.     rectum at 0 cm to descending colon around 40 cm friable.granular     mucosa, edema, specks of blood, no aphthous ulcers, no skipped     lesions Multiple biopsies were obtained and sent to pathology (see     image4, image5, image6, and image7). #1 90-cecum, #2 60-80cm     tyransverse, # 3 30-60cm, #4 0-30cm,  This was otherwise a normal     examination of the colon (see image3, image2, and image1).     Retroflexed views in the rectum revealed no abnormalities.     The     scope was then withdrawn from the patient and the procedure     completed.           COMPLICATIONS:  None     ENDOSCOPIC IMPRESSION:     1) Colitis - left UC in the sigmoid to descending colon segments           2) Otherwise normal examination     mild to moderate U.Colitis 0-40 cm, left sided, s/p multiple     biopsies in 4 separate containers,     RECOMMENDATIONS:     1) Await pathology results     continue Prednisone 30 mg qd,     low residue diet     Mesalamine or Sulfasalazine     Bentyl 10 mg tid     OV 4-6 weeks     consider adding immunomodulatore such as Imuran  REPEAT EXAM:  No           ______________________________     Hedwig Morton. Juanda Chance, MD           CC:           n.     eSIGNED:   Hedwig Morton. Brodie at 05/09/2010 12:39 PM           [place], [place], [id number]  Note: An exclamation mark (!) indicates a result that was not dispersed into the flowsheet. Document Creation Date: 05/09/2010 12:40 PM _______________________________________________________________________  (1) Order result status: Final Collection or observation date-time: 05/09/2010 12:24 Requested date-time:  Receipt date-time:  Reported date-time:  Referring Physician:   Ordering Physician: [name] [phone]) Specimen Source:  Source: Launa Grill Order Number: [id number] Lab site:   Appended Document: Colonoscopy     Procedures Next Due Date:    Colonoscopy: 04/2015

## 2010-09-27 NOTE — Progress Notes (Signed)
Summary: TRIAGE-Headache  Phone Note Call from Patient Call back at Home Phone [phone]   Call For: Dr Juanda Chance Reason for Call: Talk to Nurse Summary of Call: Medications ordered for her are causing a big headache Initial call taken by: Leanor Kail [facility],  February 02, 2010 9:46 AM  Follow-up for Phone Call        Pt. completed a Prednisone taper. Began Lialda 2 two times a day on  01-18-10.  No more blood, BM's are back to normal.  Pt. began with headaches 2 days ago, worse on Monday.  Pt. states she has occ. headaches, but not as severe as this. Has tried Excedrin and Motrin, they help for a short period of time, but the headache returns.   DR.Kirbi Farrugia PLEASE ADVISE  Follow-up by: Laureen Ochs LPN,  February 02, 3085 11:01 AM  Additional Follow-up for Phone Call Additional follow up Details #1::        please decrease Lialda to 1.2 gm/day Additional Follow-up by: Hart Carwin MD,  February 02, 2010 1:05 PM    Additional Follow-up for Phone Call Additional follow up Details #2::    Above MD orders reviewed with patient. Pt. to keep scheduled office visit. Pt. instructed to call back as needed.  Follow-up by: Laureen Ochs LPN,  February 03, 5783 1:24 PM

## 2010-09-27 NOTE — Assessment & Plan Note (Signed)
Summary: rectal bleeding several weeks...em   History of Present Illness Visit Type: consult  Primary GI MD: [name] MD Primary Provider: Huel Cote, MD Requesting Provider: Huel Cote, MD Chief Complaint: Generalized abd pain, BRB in stool after BMs, diarrhea, constipation, black tarry stools, and fecal incontinence History of Present Illness:   This is a 45 year old African American female from [place]. She has a history ofacute  diarrhea and urgent incontinent bowel movements with the blood. She has had some leakage of blood and mucus. Her usual bowel habits are one bowel movement daily or every other day. There is no family history of ulcerative colitis. Her weight has remained stable. She had a therapeutic D&C for missed  abortion. Her diarrhea started afterwards. She denies taking any antibiotics and has 3 children. There is a history of a thyroidectomy.   GI Review of Systems    Reports abdominal pain, acid reflux, and  heartburn.     Location of  Abdominal pain: generalized.    Denies belching, bloating, chest pain, dysphagia with liquids, dysphagia with solids, loss of appetite, nausea, vomiting, vomiting blood, weight loss, and  weight gain.      Reports black tarry stools, change in bowel habits, constipation, diarrhea, and  rectal bleeding.     Denies anal fissure, diverticulosis, fecal incontinence, heme positive stool, hemorrhoids, irritable bowel syndrome, jaundice, light color stool, liver problems, and  rectal pain.    Current Medications (verified): 1)  Claritin 10 Mg Tabs (Loratadine) .... As Needed  Allergies (verified): No Known Drug Allergies  Past History:  Past Medical History: Reviewed history from 01/04/2010 and no changes required. Current Problems:  HYPOTHYROIDISM (ICD-244.9) RECTAL BLEEDING (ICD-569.3)    Past Surgical History: Reviewed history from 01/04/2010 and no changes required. C-Section x 3 D & E after missed abortion in  2006 Thyroidectomy Breast Cystectomy D & E -10/2009  Family History: No FH of Colon Cancer: Stomach Cancer: MGM Family History of Diabetes: PGF Family History of Heart Disease: Father   Social History: Self Employed Married 3 childern Patient has never smoked.  Alcohol Use - no Illicit Drug Use - no Smoking Status:  never  Review of Systems       The patient complains of allergy/sinus, fatigue, headaches-new, skin rash, and sleeping problems.  The patient denies anemia, anxiety-new, arthritis/joint pain, back pain, blood in urine, breast changes/lumps, change in vision, confusion, cough, coughing up blood, depression-new, fainting, fever, hearing problems, heart murmur, heart rhythm changes, itching, menstrual pain, muscle pains/cramps, night sweats, nosebleeds, pregnancy symptoms, shortness of breath, sore throat, swelling of feet/legs, swollen lymph glands, thirst - excessive, urination - excessive, urination changes/pain, urine leakage, vision changes, and voice change.         Pertinent positive and negative review of systems were noted in the above HPI. All other ROS was otherwise negative.   Vital Signs:  Patient profile:   45 year old female Height:      64 inches Weight:      134 pounds BMI:     23.08 BSA:     1.65 Pulse rate:   96 / minute Pulse rhythm:   regular BP sitting:   116 / 64  (left arm) Cuff size:   regular  Vitals Entered By: Ok Anis CMA (Jan 06, 2010 3:04 PM)  Physical Exam  General:  Well developed, well nourished, no acute distress. Mouth:  No deformity or lesions, dentition normal. Neck:  Supple; no masses or thyromegaly. Lungs:  Clear  throughout to auscultation. Heart:  Regular rate and rhythm; no murmurs, rubs,  or bruits. Abdomen:  soft tender in left lower and left upper quadrant also in the right lower quadrant. No rebound. Normoactive bowel sounds. No fullness. Rectal:  rectal and anoscopic exam reveals acute proctitis with bleeding  mucosa and purulent discharge. Findings are consistent with acute colitis. There are no significant hemorrhoids. Extremities:  No clubbing, cyanosis, edema or deformities noted. Skin:  Intact without significant lesions or rashes. Psych:  Alert and cooperative. Normal mood and affect.   Impression & Recommendations:  Problem # 1:  ANEMIA (ICD-285.9) I have suggested to the patient that she start over-the-counter iron supplements. She has blood loss through the GI tract via vaginal bleeding.  Problem # 2:  RECTAL BLEEDING (ICD-569.3) Patient has a new diagnosis of ulcerative colitis. We need to rule out ulcerative proctitis. vs pancolitis but she  has no insurance and therefore cannot afford to have a colonoscopy. We will treat her empirically with  prednisone 30 mg a day for 2 weeks and then a prednisone taper by 5 mg every 2 weeks. We will also start here on hydrocortisone suppositories 25 g to use at bedtime. She will  need Valium 5 mg for sleep while she is on prednisone. I have given her a brochure on ulcerative colitis. She will eventually need a colonoscopy.  She will need  mesalamine products for control of colitis. We will see her in 6 weeks for a follow up. and I may be able to get her samples of Lialda.  Patient Instructions: 1)  We have sent a prescription for prednisone to your pharmacy for you to pick up. Please take as follows: 2)                              Take 3 tablets by mouth x 1 week 3)                              Take 2 tablets by mouth x 2 weeks 4)                              Take 1 1/2 tablets by mouth x 2 weeks. 5)                              Take 1 tablet by mouth x 2 weeks. 6)                              Take 1/2 tablet by mouth x 2 weeks. 7)                                                    STOP 8)  Hydrocortisone supp 25 mg 1 at bedtime. 9)  We have sent a prescription for cortisone suppositories to your pharmacy for you to take once every night. 10)  We  have sent Diazepam to your pharmacy. 11)  We will try to arrange for Mesalamine sapamples on Your next visit. 12)  Please take a 65 mg iron supplement (over the  counter) once daily. 13)  You have been given a handout on ulcerative colitis. 14)  You will need a follow up office visit in 6 weeks. 15)  Copy sent to : Dr Huel Cote 16)  The medication list was reviewed and reconciled.  All changed / newly prescribed medications were explained.  A complete medication list was provided to the patient / caregiver. Prescriptions: DIAZEPAM 5 MG TABS (DIAZEPAM) Take 1 tablet by mouth at bedtime as needed  #30 x 0   Entered by:   Lamona Curl CMA (AAMA)   Authorized by:   Hart Carwin MD   Signed by:   Lamona Curl CMA (AAMA) on 01/06/2010   Method used:   Printed then faxed to ...       [facility]       [address]       [place], [place]  [address]       Ph: [phone]       Fax: [phone]   RxID:   [id number] ANUSOL-HC 25 MG SUPP (HYDROCORTISONE ACETATE) Insert 1 suppository into rectum at bedtime  #30 x 1   Entered by:   Lamona Curl CMA (AAMA)   Authorized by:   Hart Carwin MD   Signed by:   Lamona Curl CMA (AAMA) on 01/06/2010   Method used:   Electronically to        [facility] (retail)       [address]       [place], [place]  [address]       Ph: [phone]       Fax: [phone]   RxID:   [id number] PREDNISONE 10 MG TABS (PREDNISONE) Take as directed.  #60 x 0   Entered by:   Lamona Curl CMA (AAMA)   Authorized by:   Hart Carwin MD   Signed by:   Lamona Curl CMA (AAMA) on 01/06/2010   Method used:   Electronically to        [facility] (retail)       [address]       [place], [place]  [address]       Ph: [phone]       Fax: [phone]   RxID:   [id number]

## 2010-09-27 NOTE — Letter (Signed)
Summary: New Patient letter  [facility]  [address] [place], [place] [address]   Phone: [phone]  Fax: [phone]       12/22/2009 MRN: [id number]  [facility] [address] [place], [place]  [address]  Dear Ms. Stephanie Wilkerson,  Welcome to the Gastroenterology Division at [facility].    You are scheduled to see Dr.  [name] on Jan 06, 2010 at 2:45pm on the 3rd floor at [facility], [address]  We ask that you try to arrive at our office 15 minutes prior to your appointment time to allow for check-in.  We would like you to complete the enclosed self-administered evaluation form prior to your visit and bring it with you on the day of your appointment.  We will review it with you.  Also, please bring a complete list of all your medications or, if you prefer, bring the medication bottles and we will list them.  Please bring your insurance card so that we may make a copy of it.  If your insurance requires a referral to see a specialist, please bring your referral form from your primary care physician.  Co-payments are due at the time of your visit and may be paid by cash, check or credit card.     Your office visit will consist of a consult with your physician (includes a physical exam), any laboratory testing he/she may order, scheduling of any necessary diagnostic testing (e.g. x-ray, ultrasound, CT-scan), and scheduling of a procedure (e.g. Endoscopy, Colonoscopy) if required.  Please allow enough time on your schedule to allow for any/all of these possibilities.    If you cannot keep your appointment, please call [phone] to cancel or reschedule prior to your appointment date.  This allows [place] the opportunity to schedule an appointment for another patient in need of care.  If you do not cancel or reschedule by 5 p.m. the business day prior to your appointment date, you will be charged a $50.00 late cancellation/no-show fee.    Thank you for  choosing [facility] for your medical needs.  We appreciate the opportunity to care for you.  Please visit [place] at our website  to learn more about our practice.                     Sincerely,                                                             The Gastroenterology Division

## 2010-09-29 NOTE — Assessment & Plan Note (Signed)
Summary: follow-up [facility].C.   History of Present Illness Visit Type: Follow-up Visit Primary GI MD: [name] MD Primary Nandana Krolikowski: Huel Cote, MD Requesting Madison Albea: n/a Chief Complaint: F/u for ulcerative colitis. Pt states that she is fine and denies any GI complaints.  Pt states that she finished Lialda today and wants to know if she should continue it   History of Present Illness:   This is a 45 year old [place] female with a recent diagnosis of a colitis initially diagnosed in May 2011. She had a flareup in September 2011 which responded to a steroid taper. She has been off steroids for several weeks. Her colitis is predominantly left-sided. Biopsies from 0-40 cm showed increased cryptitis and neutrophilic infiltrate consistent with acute as well as chronic colitis. She has been on Lialda 1.2 g 2 tablets a day and has no complaints today. Her stools are somewhat soft and she is having only 1-2 a day. She denies any rectal bleeding or abdominal pain. She had severe iron deficiency with a 3% iron saturation and low ferritin of 7. She was put on iron supplements but she has not been taking them on a regular basis.   GI Review of Systems      Denies abdominal pain, acid reflux, belching, bloating, chest pain, dysphagia with liquids, dysphagia with solids, heartburn, loss of appetite, nausea, vomiting, vomiting blood, weight loss, and  weight gain.        Denies anal fissure, black tarry stools, change in bowel habit, constipation, diarrhea, diverticulosis, fecal incontinence, heme positive stool, hemorrhoids, irritable bowel syndrome, jaundice, light color stool, liver problems, rectal bleeding, and  rectal pain.    Current Medications (verified): 1)  Lialda 1.2 Gm Tbec (Mesalamine) .... Take 2 Tablets By Mouth Once Daily(Finished Today)  Allergies (verified): No Known Drug Allergies  Past History:  Past Medical History: HYPOTHYROIDISM (ICD-244.9) UNSPECIFIED COLITIS     Past Surgical History: Reviewed history from 05/03/2010 and no changes required. C-Section x 3 D &C after missed abortion in 2006 Thyroidectomy Breast Cystectomy D & C -10/2009      Family History: Reviewed history from 01/06/2010 and no changes required. No FH of Colon Cancer: Stomach Cancer: MGM Family History of Diabetes: PGF Family History of Heart Disease: Father   Social History: Reviewed history from 01/06/2010 and no changes required. Self Employed Married 3 childern Patient has never smoked.  Alcohol Use - no Illicit Drug Use - no  Review of Systems  The patient denies allergy/sinus, anemia, anxiety-new, arthritis/joint pain, back pain, blood in urine, breast changes/lumps, change in vision, confusion, cough, coughing up blood, depression-new, fainting, fatigue, fever, headaches-new, hearing problems, heart murmur, heart rhythm changes, itching, menstrual pain, muscle pains/cramps, night sweats, nosebleeds, pregnancy symptoms, shortness of breath, skin rash, sleeping problems, sore throat, swelling of feet/legs, swollen lymph glands, thirst - excessive , urination - excessive , urination changes/pain, urine leakage, vision changes, and voice change.         Pertinent positive and negative review of systems were noted in the above HPI. All other ROS was otherwise negative.   Vital Signs:  Patient profile:   45 year old female Height:      64 inches Weight:      144 pounds BMI:     24.81 BSA:     1.70 Pulse rate:   80 / minute Pulse rhythm:   regular BP sitting:   98 / 60  (left arm) Cuff size:   regular  Vitals Entered By: Tresa Endo  Katrinka Blazing CMA (August 08, 2010 10:11 AM)  Physical Exam  General:  Well developed, well nourished, no acute distress. Lungs:  Clear throughout to auscultation. Heart:  Regular rate and rhythm; no murmurs, rubs,  or bruits. Abdomen:  Soft, nontender and nondistended. No masses, hepatosplenomegaly or hernias noted. Normal bowel  sounds. Extremities:  No clubbing, cyanosis, edema or deformities noted. Skin:  Intact without significant lesions or rashes.   Impression & Recommendations:  Problem # 1:  UNSPECIFIED ULCERATIVE COLITIS (ICD-556.9) Patient has predominantly left-sided ulcerative colitis which responded to a prednisone taper. She is now on maintenance therapy and is doing well. She could not take  4.8 g mesalamine a day because of headaches. She is currently on 2.4 g and is doing well. A repeat colonoscopy will be due in May 2016 if all stays well.  Problem # 2:  ANEMIA (ICD-285.9) I have given the patient additional iron supplements to take today. She did not want to have her blood count rechecked.  Patient Instructions: 1)  Please schedule a follow-up appointment in 6 months. 2)  Please continue current medications.  3)  Copy sent to : Dr Huel Cote 4)  The medication list was reviewed and reconciled.  All changed / newly prescribed medications were explained.  A complete medication list was provided to the patient / caregiver.

## 2010-11-07 ENCOUNTER — Telehealth: Payer: Self-pay | Admitting: Internal Medicine

## 2010-11-15 NOTE — Progress Notes (Signed)
Summary: speak to Dr Juanda Chance  Phone Note Call from Patient Call back at [facility] Phone [phone]   Caller: Patient Call For: Dr Juanda Chance Reason for Call: Talk to Nurse Summary of Call: Patient wants to speak directly to Dr Juanda Chance. Initial call taken by: Tawni Levy,  November 07, 2010 4:35 PM  Follow-up for Phone Call        Patient calling to report that she "finished" her Lialda on Friday. She is calling to report loose stool with mucous and little bit of bright red, blood. When she wipes she sees blood on the tissue. Also, reporting a lot of gas, left sided pain. Last colonoscopy- 05/09/10- mild to moderate u. coloitis left sided. Please, advise Follow-up by: Jesse Fall RN,  November 07, 2010 4:49 PM  Additional Follow-up for Phone Call Additional follow up Details #1::        She needs to continue her lIALDA 4.8 GM/DAY , 2 tabs two times a day. Please offer her a sample bottle in my office. Her symptoms suggest that her colitis is not under perfect control. In order for her to ger better, I would have to put her on an additional medication  ( Imuran) which may be expensive and I don't have samples of it. make sure she is taking her Iron.If we don't have any more Lialda samples in my office, I would put her on Sulfasalazine 500mg , 2 by mouth three times a day,  #180 with Folic acid 1mg  by mouth once daily, #100, Additional Follow-up by: Hart Carwin MD,  November 07, 2010 11:14 PM    Additional Follow-up for Phone Call Additional follow up Details #2::    Spoke with patient. She will pick up the bottle of Lialda and take 2 tabs by mouth two times a day. Follow-up by: Jesse Fall RN,  November 08, 2010 10:57 AM  New/Updated Medications: LIALDA 1.2 GM TBEC (MESALAMINE) take 2 tablets by mouth two times a day

## 2010-11-20 LAB — CBC
HCT: 33.3 % — ABNORMAL LOW (ref 36.0–46.0)
MCV: 69.7 fL — ABNORMAL LOW (ref 78.0–100.0)
Platelets: 360 10*3/uL (ref 150–400)
RDW: 19.5 % — ABNORMAL HIGH (ref 11.5–15.5)

## 2010-11-28 ENCOUNTER — Telehealth: Payer: Self-pay | Admitting: Internal Medicine

## 2010-11-28 MED ORDER — PREDNISONE 10 MG PO TABS: ORAL_TABLET | ORAL | Status: AC

## 2010-11-28 MED ORDER — MERCAPTOPURINE 50 MG PO TABS: ORAL_TABLET | ORAL | Status: DC

## 2010-11-28 NOTE — Telephone Encounter (Signed)
Dr. Juanda Chance spoke with patient. Rx sent to pharmacy as per Dr. Juanda Chance.

## 2010-11-28 NOTE — Telephone Encounter (Signed)
Pt called concerning recurrent rectal bleeding and diarrhea, unable to sleep, Please call Prednisone 10 mg, # 150,1 refills,  3 po qd x 2 weeks, then 2 1/2 tab (25mg ), po qd, continue Lialda 1.2 gm 4/day, please call 50mg , #30, 1 po qd 3 refill,

## 2010-11-28 NOTE — Telephone Encounter (Signed)
Patient calling to report that for the last week, she is having diarrhea with bright, red blood every time she eats something. She is taking Lialda 1.2 gram- 2 tabs in AM and 2 tabs in PM. States she is not any better since taking the Lialda. Last ov 08/08/10. Please, advise.

## 2010-11-28 NOTE — Telephone Encounter (Signed)
Addended by: Jesse Fall on: 11/28/2010 02:32 PM   Modules accepted: Orders

## 2010-11-28 NOTE — Telephone Encounter (Signed)
Spoke with pt regarding starting and she is afraid to take it after reviewing all the side effects. She prefers to take another  Course of Prednisone and continue her Lialda 1.2 gm 4/day. She will call with an update in 1 week

## 2010-11-30 ENCOUNTER — Telehealth: Payer: Self-pay | Admitting: Internal Medicine

## 2010-11-30 NOTE — Telephone Encounter (Signed)
I have spoken to Dr Mellissa Kohut , pt's husband, who is currently in [place]. Discussed Prednisone, and possible hospitalization. She was feeling better today on the Prednisone, so we are going to hold off  On .Pt is going to call me with status update in 1 week. We discussed possible hospitalization if she is not better

## 2010-11-30 NOTE — Telephone Encounter (Signed)
Spoke with patient's husband who is overseas. He states his wife is sick and getting worse every day. He wants to talk with Dr. Juanda Chance re: her condition and plan of treatment. He has an [place] number [phone]. He asks to be called soon.

## 2010-12-05 ENCOUNTER — Telehealth: Payer: Self-pay | Admitting: Internal Medicine

## 2010-12-05 NOTE — Telephone Encounter (Signed)
Please add on to my schedule for tomorrow

## 2010-12-05 NOTE — Telephone Encounter (Signed)
Pt advised to come for an appt with Dr Juanda Chance at 11:30 tomorrow

## 2010-12-05 NOTE — Telephone Encounter (Signed)
Pt is still having new symptoms of abdominal pain last night was just above the belly button, now her entire abdomen..  Still having diarrhea 5 episodes in 2 hours yesterday, she is having a slight improvement in her bleeding, still also having urgency.  She was asked to call back with an update after being on prednisone for 1 week.  Dr Juanda Chance please advise.

## 2010-12-06 ENCOUNTER — Ambulatory Visit (INDEPENDENT_AMBULATORY_CARE_PROVIDER_SITE_OTHER): Payer: Self-pay | Admitting: Internal Medicine

## 2010-12-06 ENCOUNTER — Encounter: Payer: Self-pay | Admitting: Internal Medicine

## 2010-12-06 ENCOUNTER — Other Ambulatory Visit (INDEPENDENT_AMBULATORY_CARE_PROVIDER_SITE_OTHER): Payer: Self-pay

## 2010-12-06 VITALS — BP 108/72 | HR 88 | Ht 64.0 in | Wt 134.8 lb

## 2010-12-06 DIAGNOSIS — K5100 Ulcerative (chronic) pancolitis without complications: Secondary | ICD-10-CM

## 2010-12-06 DIAGNOSIS — D509 Iron deficiency anemia, unspecified: Secondary | ICD-10-CM

## 2010-12-06 DIAGNOSIS — K625 Hemorrhage of anus and rectum: Secondary | ICD-10-CM

## 2010-12-06 LAB — CBC WITH DIFFERENTIAL/PLATELET
Eosinophils Relative: 0.5 % (ref 0.0–5.0)
Monocytes Relative: 6.8 % (ref 3.0–12.0)
Neutrophils Relative %: 64.1 % (ref 43.0–77.0)
Platelets: 354 10*3/uL (ref 150.0–400.0)
RDW: 19.3 % — ABNORMAL HIGH (ref 11.5–14.6)

## 2010-12-06 MED ORDER — MESALAMINE 1.2 G PO TBEC: DELAYED_RELEASE_TABLET | ORAL | Status: DC

## 2010-12-06 MED ORDER — DIAZEPAM 5 MG PO TABS: 5.0000 mg | ORAL_TABLET | Freq: Every evening | ORAL | Status: AC | PRN

## 2010-12-06 NOTE — Progress Notes (Signed)
Stephanie Wilkerson Nov 29, 1965 MRN [id number]        History of Present Illness:  This is a 45 year old [place] female with new diagnosis of ulcerative colitis made in May 2011 on anoscopic exam. She had a flareup in September 2011 and again over past 4  weeks. She is describing frequent loose stools preceded by  crampy abdominal pain and blood, also urgency and bloody mucous.. Her colonoscopy in September 2011 showed left-sided colitis from 0-60 cm. Biopsies showed  cryptitis and increased neutrophilic infiltrate. She was on prednisone taper during her first flareup and again has started prednisone taper 40 mg daily one week ago. She  Notices  that  a slight l improvement. She is also on mesalamine 4.8 g daily Lialda 1.2 gm) She has a history of severe  iron deficiency with a hemoglobin of 10.1 and iron saturation 3.1%. She was initially on iron supplements but not currently. Upper abdominal ultrasound in May 2005 was negative   Past Medical History  Diagnosis Date  . Hypothyroidism   . Colitis    Past Surgical History  Procedure Date  . Cesarean section     x 3  . Dilation and curettage, diagnostic / therapeutic 2006, 2011  . Thyroidectomy   . Breast cyst excision     located under breast    reports that she has never smoked. She has never used smokeless tobacco. She reports that she does not drink alcohol or use illicit drugs. family history includes Diabetes in her paternal grandfather; Heart disease in her father; and Stomach cancer in her maternal grandmother.  There is no history of Colon cancer. No Known Allergies      Review of Systems: Denies show bulb breath or chest pain. No dysphagia or symptoms of reflux. Level of energy has been low. She's had trouble sleeping  The remainder of the 10  point ROS is negative except as outlined in H&P   Physical Exam: General appearance  Well developed in no distress Eyes- non icteric HEENT nontraumatic, normocephalic Mouth no  lesions, tongue papillated, no cheilosis Neck supple without adenopathy, thyroid not enlarged, no carotid bruits, no JVD Lungs Clear to auscultation bilaterally Cor normal S1 normal S2, regular rhythm , no murmur,  quiet precordium Abdomen soft relaxed with normoactive bowel sounds. Slightly tender in left lower quadrant. No rebound or palpable mass, liver edge at costal margin Rectal: Soft Hemoccult-positive stool Extremities no pedal edema Skin no lesions Neurological alert and oriented x3 Psychological normal mood and  affect  Assessment and Plan:   Problem #1 ulcerative colitis of one-year duration with the only brief period of remission. She is on her third course of prednisone. We have discussed immunomodulators Imuran but patient and her husband are reluctant at this point to use it because of immunosuppressive fracture. I've also mention biological. She and her husband may be moving back to [place]. Her husband is there chronically. She has 3 children here and has a lot of stress managing the household without her husband. This is likely contributing to the flareup of her colitis. She needs to get back home, I am supplements. I have given her samples of Integra to take a daily . We will check her CBC today. She will continue on prednisone 40 mg a day for at least a week or two. and call us in one week with status update. We will be more conservative at this time with her prednisone taper. She will continue on her mesalamine 4.8 g daily  he also sending diazepam 5 mg to take when necessary agitation and anxiety   Iron deficiency anemia. Check CBC today. Samples of iron supplements given   12/06/2010 [name]

## 2010-12-06 NOTE — Patient Instructions (Addendum)
Your physician has requested that you go to the basement for the following lab work before leaving today: CBC You have been given Lialda samples to take 2 tablets by mouth twice daily. We have given you a prescription for Diazepam to take 1 tablet at bedtime.    Low-Fiber and Residue Restricted Diets A low-fiber diet restricts foods that contain carbohydrates that are not digested in the small intestine. A diet containing about 10 grams of fiber is considered low-fiber. The diet needs to be individualized to suit patient tolerances and preferences and to avoid unnecessary restrictions. Generally, the foods emphasized in a low-fiber diet have no skins or seeds. They may have been processed to remove bran, germ, or husks. Cooking may not necessarily eliminate the fiber. Cooking may, in fact, enable a greater quantity of fiber to be consumed in a lesser volume. Legumes and nuts are also restricted. The term low-residue has also been used to describe low-fiber diets, although the two are not the same. Residue refers to any substance that adds to bowel (colonic) contents, such as sloughed cells and intestinal germs (bacteria) in addition to fiber. Residue-containing foods, prunes and prune juice, milk, and connective tissue from meats may also need to be eliminated. It is important to eliminate these foods during sudden (acute) attacks of inflammatory bowel disease, when there is a partial obstruction due to another reason, or when minimal fecal output is desired. When problems are in remission, a more normal diet may be used. PURPOSE  Prevent blockage of a partially obstructed or narrowed gastrointestinal tract.   Reduce stool weight and volume.   Slow the movement of waste.  WHEN IS THIS DIET USED?  Acute phase of Crohn's disease, ulcerative colitis, regional enteritis, or diverticulitis.   Narrowing (stenosis) of intestinal or esophageal tubes (lumina).   Transitional diet following surgery,  injury (trauma), or illness.  ADEQUACY This diet is nutritionally adequate based on individual food choices according to the Recommended Dietary Allowances of the [employer]. SPECIAL NOTES In severe cases, it is recommended that residue containing foods, prunes and prune juice, milk, and connective tissue from meats be eliminated. Check labels, especially on foods from the starch list. Often, dietary fiber content is listed with the nutrition information. Since products are continually introduced or removed from the grocery shelf, this list may not be complete. FOOD GROUP ALLOWED/RECOMMENDED AVOID/USE SPARINGLY  STARCHES 6 servings or more daily    Breads White, [place], and pita breads, plain rolls, buns, or sweet rolls, doughnuts, waffles, pancakes, bagels. Plain muffins, sweet breads, biscuits, matzoth. Flour. Bread, rolls, or crackers made with whole-wheat, multigrains, rye, bran seeds, nuts, or coconut. Corn tortillas, table-shells.  Crackers Soda, saltine, or graham crackers. Pretzels, rusks, melba toast, zwieback. See above. Corn chips, tortilla chips.  Cereals Cooked cereals: cornmeal, farina, cream cereals. Dry cereals: refined corn, wheat, rice, and oat cereals (check label). Cereals containing whole-grains, multigrains, bran, coconut, nuts, or raisins. Cooked or dry oatmeal. Coarse wheat cereals, granola. Cereals advertised as "high fiber."  Potatoes/Pasta/Rice Potatoes prepared any way without skins, refined macaroni, spaghetti, noodles, refined rice. Potato skins. Whole-grain pasta, wild or brown rice. Popcorn.  VEGETABLES 2 to 3 servings or more daily Strained tomato and vegetable juices. Fresh: tender lettuce, cucumber, cabbage, spinach, bean sprouts. Cooked, canned: asparagus, bean sprouts, cut green or wax beans, cauliflower, pumpkin, beets, mushrooms, olives, spinach, yellow squash, tomato, tomato sauce (no seeds), zucchini (peeled), turnips. Canned sweet potatoes.  Small amounts of celery, onion, radish, and  green pepper may be used. Keep servings limited to 1/2 cup. Fresh, cooked, or canned: artichokes, baked beans, beet greens, broccoli, Brussels sprouts, French-style green beans, corn, kale, legumes, peas, sweet potatoes. Cooked: green or red cabbage, spinach. Avoid large servings of any vegetables.  FRUIT 2 to 3 servings or more daily All fruit juices except prune juice. Cooked or canned: apricots applesauce, cantaloupe, cherries, grapefruit, grapes, kiwi, mandarin oranges, peaches, pears, fruit cocktail, pineapple, plums, watermelon. Fresh: banana, grapes, cantaloupe, avocado, cherries, pineapple, grapefruit, kiwi, nectarines, peaches, oranges, blueberries, plums. Keep servings limited to 1/2 cup or 1 piece. Fresh: apple with or without skin, apricots, mango, pears, raspberries, strawberries. Prune juice, stewed or dried prunes. Dried fruits, raisins, dates. Avoid large servings of all fresh fruits.  MEAT AND MEAT SUBSTITUTES 2 servings or more (4 to 6 total daily) Ground or well-cooked tender beef, ham, veal, lamb, pork, or poultry. Eggs, plain cheese. Fish, oysters, shrimp, lobster, other seafood. Liver, organ meats. Tough, fibrous meats with gristle. Peanut butter, smooth or chunky. Cheese with seeds, nuts, or other foods not allowed. Nuts, seeds, legumes, dried peas, beans, lentils.  MILK 2 cups or equivalent daily All milk products except those not allowed. Milk and milk product consumption should be minimal when low residue is desired. Yogurt that contains nuts or seeds.  SOUPS AND COMBINATION FOODS Bouillon, broth, or cream soups made from allowed foods. Any strained soup. Casseroles or mixed dishes made with allowed foods. Soups made from vegetables that are not allowed or that contain other foods not allowed.  DESSERTS AND SWEETS In moderation Plain cakes and cookies, pie made with allowed fruit, pudding, custard, cream pie. Gelatin, fruit, ice, sherbet,  frozen ice pops. Ice cream, ice milk without nuts. Plain hard candy, honey, jelly, molasses, syrup, sugar, chocolate syrup, gumdrops, marshmallows. Desserts, cookies, or candies that contain nuts, peanut butter, or dried fruits. Jams, preserves with seeds, marmalade.  FATS AND OILS In moderation Margarine, butter, cream, mayonnaise, salad oils, plain salad dressings made from allowed foods. Plain gravy, crisp bacon without rind. Seeds, nuts, olives. Avocados.  BEVERAGES All, except those listed to avoid. Fruit juices with high pulp, prune juice.  CONDIMENTS/ MISCELLANEOUS Ketchup, mustard, horseradish, vinegar, cream sauce, cheese sauce, cocoa powder. Spices in moderation: allspice, basil, bay leaves, celery powder or leaves, cinnamon, cumin powder, curry powder, ginger, mace, marjoram, onion or garlic powder, oregano, paprika, parsley flakes, ground pepper, rosemary, sage, savory, tarragon, thyme, turmeric. Coconut, pickles.  A serving is equal to: 1/2 cup for fruits, vegetables, and cooked cereals or 1 piece for foods such as a piece of bread, 1 orange, or 1 apple. For dry cereals and crackers, use serving sizes listed on the label. SAMPLE MEAL PLAN The following menu is provided as a sample. Your daily menu plans will vary. Be sure to include a minimum of the following each day in order to provide essential nutrients for the adult: Starch/Bread/Cereal Group Fruit/Vegetable Group Meat/Meat Substitute Group Milk/Milk Substitute Group 6 servings 5 servings 2 servings 2 servings  Combination foods may count as full or partial servings from various food groups. Fats, desserts, and sweets may be added to the meal plan after the requirements for essential nutrients are met. SAMPLE MENU Breakfast Lunch Supper  1/2 cup orange juice 1/2 cup chicken noodle soup 3 ounces baked chicken  1 boiled egg 2 to 3 ounces sliced roast beef 1/2 cup scalloped potatoes  1 slice white toast 2 slices seedless rye  bread 1/2 cup cooked beets  Margarine Mayonnaise White dinner roll  3/4 cup cornflakes 1/2 cup tomato juice Margarine  1 cup milk 1 small banana 1/2 cup canned peaches  Beverage Beverage Beverage  Document Released: 02/03/2002 Document Re-Released: 11/08/2009 [facility] Patient Information 2011 [place], [place].

## 2010-12-08 ENCOUNTER — Telehealth: Payer: Self-pay | Admitting: *Deleted

## 2010-12-08 NOTE — Telephone Encounter (Signed)
Patient notified of results and recommendations as per Dr. Juanda Chance.

## 2010-12-08 NOTE — Telephone Encounter (Signed)
Message copied by Jesse Fall on Thu Dec 08, 2010  9:51 AM ------      Message from: [name]      Created: Wed Dec 07, 2010 10:11 PM       Please call pt, CBC mildly low but not critical. Please continue to take iron samples I gave her.

## 2010-12-12 ENCOUNTER — Telehealth: Payer: Self-pay | Admitting: Internal Medicine

## 2010-12-12 NOTE — Telephone Encounter (Signed)
Patient calling to report she feels much better. Bleeding has stopped and she is having more regular bowel movements. She wants to know if she should stay on Prednisone 40 mg or decrease it to 30 mg. Please, advise.

## 2010-12-12 NOTE — Telephone Encounter (Signed)
Please reduce prednisone to 30 mg po qd x 2 weeks, then down by 5 mg every 2 weeks, don't try to shorten it, it would come back is she did speed up the prednisone taper.

## 2010-12-13 NOTE — Telephone Encounter (Signed)
Left a message for patient to call me. 

## 2010-12-13 NOTE — Telephone Encounter (Signed)
Patient returned our call. Patient given Dr. Regino Schultze recommendation on Prednisone taper and warned her not to shortnen the time.

## 2011-01-02 ENCOUNTER — Telehealth: Payer: Self-pay | Admitting: Internal Medicine

## 2011-01-02 MED ORDER — MESALAMINE 1.2 G PO TBEC: DELAYED_RELEASE_TABLET | ORAL | Status: DC

## 2011-01-02 NOTE — Telephone Encounter (Signed)
Advised patient that I will put samples of Lialda at the front desk for her to pick up. I have suggested that she contact Lialda to get information on their patient assistance program since she does not have insurance and may qualify. She states that she will do so.

## 2011-01-13 NOTE — Op Note (Signed)
NAMEIlda Wilkerson                ACCOUNT NO.:  [id number]   MEDICAL RECORD NO.:  [id number]          PATIENT TYPE:  MAT   LOCATION:  [facility]                          FACILITY:  [facility]   PHYSICIAN:  Huel Cote, M.D. DATE OF BIRTH:  1966/08/15   DATE OF PROCEDURE:  02/23/2005  DATE OF DISCHARGE:                                 OPERATIVE REPORT   PREOPERATIVE DIAGNOSES:  1.  Incomplete abortion at 10 weeks.  2.  Bleeding.   POSTOPERATIVE DIAGNOSIS:  1.  Incomplete abortion at 10 weeks.  2.  Bleeding.   PROCEDURE:  Suction dilation and curettage.   SURGEON:  Dr. Huel Cote.   ANESTHESIA:  MAC.   FLUIDS:  1.  Estimated blood loss 200 mL  of clots noted in vagina.  2.  IV fluids 1000 mL total.  3.  Urine output approximately 100 mL prior to procedure.   FINDINGS:  The patient had a large amount of products of conception noted in  the uterus that were evacuated. The uterus sounded to approximately 9-10  weeks and after procedure was 7 weeks.   DESCRIPTION OF PROCEDURE:  The patient was taken to the operating room where  MAC anesthesia was obtained without difficulty. She was then prepped and  draped in the normal sterile fashion in the dorsal lithotomy position. A  speculum was placed in the vagina, a large amount of clot evacuated from the  vagina and the cervix noted to be 1 cm dilated. The anterior lip of the  cervix was grasped with a tenaculum and the suction introduced into the  uterine cavity and multiple passes. A large amount of tissue was obtained.  On the final two passes very little tissue noted, therefore the suction was  discontinued and sharp curettage performed. There was no substantial amount  of tissue noted at this point and the curetting's were added to the  specimen. The patient then had one additional pass of suction with no  additional tissue noted and therefore the procedure was discontinued. No  active bleeding was noted at the conclusion of the  procedure. The uterus was  palpated and diminished to a seven week size uterus. Therefore the patient  was taken to the recovery room in stable condition. The patient will have  hematocrit checked  in the recovery room given her preoperative hematocrit had dropped to 8.8  and if the patient is hemodynamically stable, will likely be discharged home  to follow-up in the office in two weeks. Her blood type is B+ and she will  not require RhoGAM.       KR/MEDQ  D:  02/23/2005  T:  02/23/2005  Job:  [id number]

## 2011-03-02 ENCOUNTER — Telehealth: Payer: Self-pay | Admitting: Internal Medicine

## 2011-03-02 NOTE — Telephone Encounter (Signed)
Patient states she is calling to see why Dr. Juanda Chance had her increase her Lialda to 2 tablets BID.  States she would like to talk to Dr. Juanda Chance about this.  Explained to patient that Dr. Juanda Chance may answer and have me call her back. Please, advise

## 2011-03-23 ENCOUNTER — Telehealth: Payer: Self-pay | Admitting: Internal Medicine

## 2011-03-23 NOTE — Telephone Encounter (Signed)
Patient calling to report she is having blood in her stool. States this started on Monday. She is having blood in stool that is coloring there water and she has bright, red blood on the tissue when she wipes. She states she has had 3 stools today. Reports that she had been taking Lialda one tab in AM until Monday when the bleeding started. She then increased to Lialda 2 in AM and 2 in PM. Please, advise.

## 2011-03-23 NOTE — Telephone Encounter (Signed)
Left a message for patient to call me. 

## 2011-03-26 NOTE — Telephone Encounter (Signed)
Please increase  Lialda to 2 po bid ( 4/day) and call back  With status update in about 10-14 days. Call earliear if bleeding gets worse.

## 2011-03-27 NOTE — Telephone Encounter (Signed)
Pt aware and instructed to call us back in 10-14 days with an update and to call back if worsens. Pt verbalized understanding.

## 2011-05-15 ENCOUNTER — Other Ambulatory Visit: Payer: Self-pay | Admitting: *Deleted

## 2011-05-15 MED ORDER — MESALAMINE 1.2 G PO TBEC: DELAYED_RELEASE_TABLET | ORAL | Status: DC

## 2011-05-15 NOTE — Telephone Encounter (Signed)
Per Dr Juanda Chance, patient called her at home and stated that she needs some more samples of Lialda. I have put #120 tablets at the front desk for patient to pick up and have left a message for patient to pick up the medications.

## 2015-03-05 ENCOUNTER — Encounter: Payer: Self-pay | Admitting: Internal Medicine

## 2015-04-15 ENCOUNTER — Telehealth: Payer: Self-pay | Admitting: Internal Medicine

## 2015-04-15 NOTE — Telephone Encounter (Signed)
Patient will come in and see Dr. Juanda Chance at 10:30 tomorrow.  She is aware of the appt date and time

## 2015-04-16 ENCOUNTER — Ambulatory Visit (INDEPENDENT_AMBULATORY_CARE_PROVIDER_SITE_OTHER): Payer: Self-pay | Admitting: Internal Medicine

## 2015-04-16 ENCOUNTER — Encounter: Payer: Self-pay | Admitting: Internal Medicine

## 2015-04-16 VITALS — BP 110/70 | HR 68 | Ht 64.0 in | Wt 139.0 lb

## 2015-04-16 DIAGNOSIS — K5190 Ulcerative colitis, unspecified, without complications: Secondary | ICD-10-CM

## 2015-04-16 MED ORDER — INTEGRA PLUS PO CAPS: 1.0000 | ORAL_CAPSULE | Freq: Every day | ORAL | Status: AC

## 2015-04-16 MED ORDER — MESALAMINE ER 500 MG PO CPCR: 1000.0000 mg | ORAL_CAPSULE | Freq: Four times a day (QID) | ORAL | Status: DC

## 2015-04-16 MED ORDER — MESALAMINE ER 500 MG PO CPCR: 1000.0000 mg | ORAL_CAPSULE | Freq: Four times a day (QID) | ORAL | Status: AC

## 2015-04-16 NOTE — Progress Notes (Signed)
Stephanie Wilkerson Nov 25, 1965 [id number]  Note: This dictation was prepared with Dragon digital system. Any transcriptional errors that result from this procedure are unintentional.   History of Present Illness: This is a 49 year old  [place] American female from [place] with ulcerative colitis diagnosed in May 2011. She moved back to [place] in 2012 and had several flareups of colitis since then, she brought some of her medical records..We  did a colonoscopy in May 2011 which showed predominantly left-sided colitis. She has been on mesalamine Pentasa 500 mg 2 twice a day. She was on a steroid-induced taper during her flareup last September 2015 for which she was hospitalized for 30 days  In [place] . She has chronic iron deficiency anemia. She had an episode of acute abdominal pain in June of this year and was evaluated with the upper abdominal ultrasound which was normal. An MRI of the abdomen which was negative.the ethiology of that pain was uncertain - it was not associated with the colitis.    Past Medical History  Diagnosis Date  . Hypothyroidism   . Colitis     Past Surgical History  Procedure Laterality Date  . Cesarean section      x 3  . Dilation and curettage, diagnostic / therapeutic  2006, 2011  . Thyroidectomy    . Breast cyst excision      located under breast  . Colonoscopy      No Known Allergies  Family history and social history have been reviewed.  Review of Systems:   The remainder of the 10 point ROS is negative except as outlined in the H&P  Physical Exam: General Appearance Well developed, in no distress Eyes  Non icteric  HEENT  Non traumatic, normocephalic  Mouth No lesion, tongue papillated, no cheilosis Neck Supple without adenopathy, thyroid not enlarged, no carotid bruits, no JVD Lungs Clear to auscultation bilaterally COR Normal S1, normal S2, regular rhythm, no murmur, quiet precordium Abdomen soft mildly protuberant. Minimally tender in left lower  quadrant Rectal small amount of salt Hemoccult-negative stool Extremities  No pedal edema Skin No lesions Neurological Alert and oriented x 3 Psychological Normal mood and affect  Assessment and Plan:   48 year old [place] female with ulcerative colitis, predominantly left-sided. Currently asymptomatic. Samples of Pentasa and new prescription to take 2 twice a day. In case of flareup she will increase it to 8 per day. We discussed possibility of using biologicals such as Humira in the future if if she keeps having flareups. Also consider using immunomodulators such as azathioprine if biologicals not available in [place]..Take iron supplements daily.Last Hgb in June 2016 wad 11.4. She is heme negative today.    Stephanie Wilkerson 04/16/2015

## 2015-04-16 NOTE — Patient Instructions (Signed)
Your prescriptions have been printed and given to you per your request

## 2015-04-21 ENCOUNTER — Telehealth: Payer: Self-pay | Admitting: Internal Medicine

## 2015-04-26 NOTE — Telephone Encounter (Signed)
Closing encounter
